# Patient Record
Sex: Female | Born: 1990 | Race: White | Hispanic: No | Marital: Married | Smoking: Former smoker
Health system: Southern US, Community
[De-identification: ages and names within clinical notes are randomized; demographics above are authoritative.]

## PROBLEM LIST (undated history)

## (undated) DIAGNOSIS — T7840XA Allergy, unspecified, initial encounter: Secondary | ICD-10-CM

## (undated) DIAGNOSIS — G43909 Migraine, unspecified, not intractable, without status migrainosus: Secondary | ICD-10-CM

## (undated) HISTORY — DX: Allergy, unspecified, initial encounter: T78.40XA

## (undated) HISTORY — PX: APPENDECTOMY: SHX54

## (undated) HISTORY — DX: Migraine, unspecified, not intractable, without status migrainosus: G43.909

## (undated) HISTORY — PX: WISDOM TOOTH EXTRACTION: SHX21

---

## 2014-08-08 ENCOUNTER — Encounter: Payer: Self-pay | Admitting: *Deleted

## 2014-08-08 ENCOUNTER — Telehealth: Payer: Self-pay | Admitting: *Deleted

## 2014-08-08 NOTE — Telephone Encounter (Signed)
Pre-Visit Call completed with patient and chart updated.   Pre-Visit Info documented in Specialty Comments under [employer].    

## 2014-08-09 ENCOUNTER — Encounter: Payer: Self-pay | Admitting: Physician Assistant

## 2014-08-09 ENCOUNTER — Ambulatory Visit (INDEPENDENT_AMBULATORY_CARE_PROVIDER_SITE_OTHER): Payer: Managed Care, Other (non HMO) | Admitting: Physician Assistant

## 2014-08-09 VITALS — BP 113/57 | HR 57 | Temp 98.3°F | Ht 67.0 in | Wt 142.0 lb

## 2014-08-09 DIAGNOSIS — F411 Generalized anxiety disorder: Secondary | ICD-10-CM

## 2014-08-09 DIAGNOSIS — G43709 Chronic migraine without aura, not intractable, without status migrainosus: Secondary | ICD-10-CM | POA: Diagnosis not present

## 2014-08-09 DIAGNOSIS — F419 Anxiety disorder, unspecified: Secondary | ICD-10-CM | POA: Insufficient documentation

## 2014-08-09 DIAGNOSIS — Z0000 Encounter for general adult medical examination without abnormal findings: Secondary | ICD-10-CM | POA: Diagnosis not present

## 2014-08-09 DIAGNOSIS — G43909 Migraine, unspecified, not intractable, without status migrainosus: Secondary | ICD-10-CM | POA: Insufficient documentation

## 2014-08-09 DIAGNOSIS — Z7689 Persons encountering health services in other specified circumstances: Secondary | ICD-10-CM

## 2014-08-09 LAB — URINALYSIS, ROUTINE W REFLEX MICROSCOPIC
Bilirubin Urine: NEGATIVE
Ketones, ur: NEGATIVE
Leukocytes, UA: NEGATIVE
Nitrite: NEGATIVE
PH: 6.5 (ref 5.0–8.0)
TOTAL PROTEIN, URINE-UPE24: NEGATIVE
UROBILINOGEN UA: 0.2 (ref 0.0–1.0)
Urine Glucose: NEGATIVE
WBC UA: NONE SEEN (ref 0–?)

## 2014-08-09 LAB — CBC
HCT: 42.5 % (ref 36.0–46.0)
HEMOGLOBIN: 14.3 g/dL (ref 12.0–15.0)
MCHC: 33.7 g/dL (ref 30.0–36.0)
MCV: 91.9 fl (ref 78.0–100.0)
Platelets: 260 10*3/uL (ref 150.0–400.0)
RBC: 4.62 Mil/uL (ref 3.87–5.11)
RDW: 14.2 % (ref 11.5–15.5)
WBC: 6.4 10*3/uL (ref 4.0–10.5)

## 2014-08-09 LAB — LIPID PANEL
Cholesterol: 164 mg/dL (ref 0–200)
HDL: 61 mg/dL (ref >39.00)
LDL Cholesterol: 91 mg/dL (ref 0–99)
NONHDL: 103
Total CHOL/HDL Ratio: 3
Triglycerides: 60 mg/dL (ref 0.0–149.0)
VLDL: 12 mg/dL (ref 0.0–40.0)

## 2014-08-09 LAB — HEPATIC FUNCTION PANEL
ALK PHOS: 62 U/L (ref 39–117)
ALT: 32 U/L (ref 0–35)
AST: 27 U/L (ref 0–37)
Albumin: 4.2 g/dL (ref 3.5–5.2)
BILIRUBIN DIRECT: 0.1 mg/dL (ref 0.0–0.3)
BILIRUBIN TOTAL: 0.5 mg/dL (ref 0.2–1.2)
Total Protein: 7.2 g/dL (ref 6.0–8.3)

## 2014-08-09 LAB — BASIC METABOLIC PANEL
BUN: 9 mg/dL (ref 6–23)
CO2: 30 mEq/L (ref 19–32)
Calcium: 9.9 mg/dL (ref 8.4–10.5)
Chloride: 102 mEq/L (ref 96–112)
Creatinine, Ser: 0.87 mg/dL (ref 0.40–1.20)
GFR: 84.08 mL/min (ref >60.00)
Glucose, Bld: 85 mg/dL (ref 70–99)
Potassium: 3.7 mEq/L (ref 3.5–5.1)
Sodium: 137 mEq/L (ref 135–145)

## 2014-08-09 NOTE — Assessment & Plan Note (Addendum)
Health Maintenance reviewed.  PAP up-to-date.  Referral placed to gynecology per patient request.  Will check [facility] to assess tetanus immunization status (not working at time of visit). Will update if indicated. Depression screen negative.  Preventive care discussed with patient.  Handout given in AVS. Will obtain fasting labs -- CBC, BMP, LFTs, Lipid panel and UA today.

## 2014-08-09 NOTE — Patient Instructions (Signed)
Please go to the lab for blood work. I will call you with your results. If you have any Wellness Forms needing completion, please send them to me. Talk to your insurance about coverage for a counselor. Consider calling our counselors to set up an appointment.  Preventive Care for Adults A healthy lifestyle and preventive care can promote health and wellness. Preventive health guidelines for women include the following key practices.  A routine yearly physical is a good way to check with your health care provider about your health and preventive screening. It is a chance to share any concerns and updates on your health and to receive a thorough exam.  Visit your dentist for a routine exam and preventive care every 6 months. Brush your teeth twice a day and floss once a day. Good oral hygiene prevents tooth decay and gum disease.  The frequency of eye exams is based on your age, health, family medical history, use of contact lenses, and other factors. Follow your health care provider's recommendations for frequency of eye exams.  Eat a healthy diet. Foods like vegetables, fruits, whole grains, low-fat dairy products, and lean protein foods contain the nutrients you need without too many calories. Decrease your intake of foods high in solid fats, added sugars, and salt. Eat the right amount of calories for you.Get information about a proper diet from your health care provider, if necessary.  Regular physical exercise is one of the most important things you can do for your health. Most adults should get at least 150 minutes of moderate-intensity exercise (any activity that increases your heart rate and causes you to sweat) each week. In addition, most adults need muscle-strengthening exercises on 2 or more days a week.  Maintain a healthy weight. The body mass index (BMI) is a screening tool to identify possible weight problems. It provides an estimate of body fat based on height and weight. Your  health care provider can find your BMI and can help you achieve or maintain a healthy weight.For adults 20 years and older:  A BMI below 18.5 is considered underweight.  A BMI of 18.5 to 24.9 is normal.  A BMI of 25 to 29.9 is considered overweight.  A BMI of 30 and above is considered obese.  Maintain normal blood lipids and cholesterol levels by exercising and minimizing your intake of saturated fat. Eat a balanced diet with plenty of fruit and vegetables. Blood tests for lipids and cholesterol should begin at age 71 and be repeated every 5 years. If your lipid or cholesterol levels are high, you are over 50, or you are at high risk for heart disease, you may need your cholesterol levels checked more frequently.Ongoing high lipid and cholesterol levels should be treated with medicines if diet and exercise are not working.  If you smoke, find out from your health care provider how to quit. If you do not use tobacco, do not start.  Lung cancer screening is recommended for adults aged 9-80 years who are at high risk for developing lung cancer because of a history of smoking. A yearly low-dose CT scan of the lungs is recommended for people who have at least a 30-pack-year history of smoking and are a current smoker or have quit within the past 15 years. A pack year of smoking is smoking an average of 1 pack of cigarettes a day for 1 year (for example: 1 pack a day for 30 years or 2 packs a day for 15 years). Yearly screening  should continue until the smoker has stopped smoking for at least 15 years. Yearly screening should be stopped for people who develop a health problem that would prevent them from having lung cancer treatment.  If you are pregnant, do not drink alcohol. If you are breastfeeding, be very cautious about drinking alcohol. If you are not pregnant and choose to drink alcohol, do not have more than 1 drink per day. One drink is considered to be 12 ounces (355 mL) of beer, 5 ounces  (148 mL) of wine, or 1.5 ounces (44 mL) of liquor.  Avoid use of street drugs. Do not share needles with anyone. Ask for help if you need support or instructions about stopping the use of drugs.  High blood pressure causes heart disease and increases the risk of stroke. Your blood pressure should be checked at least every 1 to 2 years. Ongoing high blood pressure should be treated with medicines if weight loss and exercise do not work.  If you are 76-2 years old, ask your health care provider if you should take aspirin to prevent strokes.  Diabetes screening involves taking a blood sample to check your fasting blood sugar level. This should be done once every 3 years, after age 19, if you are within normal weight and without risk factors for diabetes. Testing should be considered at a younger age or be carried out more frequently if you are overweight and have at least 1 risk factor for diabetes.  Breast cancer screening is essential preventive care for women. You should practice "breast self-awareness." This means understanding the normal appearance and feel of your breasts and may include breast self-examination. Any changes detected, no matter how small, should be reported to a health care provider. Women in their 47s and 30s should have a clinical breast exam (CBE) by a health care provider as part of a regular health exam every 1 to 3 years. After age 83, women should have a CBE every year. Starting at age 72, women should consider having a mammogram (breast X-ray test) every year. Women who have a family history of breast cancer should talk to their health care provider about genetic screening. Women at a high risk of breast cancer should talk to their health care providers about having an MRI and a mammogram every year.  Breast cancer gene (BRCA)-related cancer risk assessment is recommended for women who have family members with BRCA-related cancers. BRCA-related cancers include breast, ovarian,  tubal, and peritoneal cancers. Having family members with these cancers may be associated with an increased risk for harmful changes (mutations) in the breast cancer genes BRCA1 and BRCA2. Results of the assessment will determine the need for genetic counseling and BRCA1 and BRCA2 testing.  Routine pelvic exams to screen for cancer are no longer recommended for nonpregnant women who are considered low risk for cancer of the pelvic organs (ovaries, uterus, and vagina) and who do not have symptoms. Ask your health care provider if a screening pelvic exam is right for you.  If you have had past treatment for cervical cancer or a condition that could lead to cancer, you need Pap tests and screening for cancer for at least 20 years after your treatment. If Pap tests have been discontinued, your risk factors (such as having a new sexual partner) need to be reassessed to determine if screening should be resumed. Some women have medical problems that increase the chance of getting cervical cancer. In these cases, your health care provider may recommend  more frequent screening and Pap tests.  The HPV test is an additional test that may be used for cervical cancer screening. The HPV test looks for the virus that can cause the cell changes on the cervix. The cells collected during the Pap test can be tested for HPV. The HPV test could be used to screen women aged 89 years and older, and should be used in women of any age who have unclear Pap test results. After the age of 78, women should have HPV testing at the same frequency as a Pap test.  Colorectal cancer can be detected and often prevented. Most routine colorectal cancer screening begins at the age of 49 years and continues through age 23 years. However, your health care provider may recommend screening at an earlier age if you have risk factors for colon cancer. On a yearly basis, your health care provider may provide home test kits to check for hidden blood in  the stool. Use of a small camera at the end of a tube, to directly examine the colon (sigmoidoscopy or colonoscopy), can detect the earliest forms of colorectal cancer. Talk to your health care provider about this at age 24, when routine screening begins. Direct exam of the colon should be repeated every 5-10 years through age 68 years, unless early forms of pre-cancerous polyps or small growths are found.  People who are at an increased risk for hepatitis B should be screened for this virus. You are considered at high risk for hepatitis B if:  You were born in a country where hepatitis B occurs often. Talk with your health care provider about which countries are considered high risk.  Your parents were born in a high-risk country and you have not received a shot to protect against hepatitis B (hepatitis B vaccine).  You have HIV or AIDS.  You use needles to inject street drugs.  You live with, or have sex with, someone who has hepatitis B.  You get hemodialysis treatment.  You take certain medicines for conditions like cancer, organ transplantation, and autoimmune conditions.  Hepatitis C blood testing is recommended for all people born from [birth year] through 1965 and any individual with known risks for hepatitis C.  Practice safe sex. Use condoms and avoid high-risk sexual practices to reduce the spread of sexually transmitted infections (STIs). STIs include gonorrhea, chlamydia, syphilis, trichomonas, herpes, HPV, and human immunodeficiency virus (HIV). Herpes, HIV, and HPV are viral illnesses that have no cure. They can result in disability, cancer, and death.  You should be screened for sexually transmitted illnesses (STIs) including gonorrhea and chlamydia if:  You are sexually active and are younger than 24 years.  You are older than 24 years and your health care provider tells you that you are at risk for this type of infection.  Your sexual activity has changed since you were last  screened and you are at an increased risk for chlamydia or gonorrhea. Ask your health care provider if you are at risk.  If you are at risk of being infected with HIV, it is recommended that you take a prescription medicine daily to prevent HIV infection. This is called preexposure prophylaxis (PrEP). You are considered at risk if:  You are a heterosexual woman, are sexually active, and are at increased risk for HIV infection.  You take drugs by injection.  You are sexually active with a partner who has HIV.  Talk with your health care provider about whether you are at high risk  of being infected with HIV. If you choose to begin PrEP, you should first be tested for HIV. You should then be tested every 3 months for as long as you are taking PrEP.  Osteoporosis is a disease in which the bones lose minerals and strength with aging. This can result in serious bone fractures or breaks. The risk of osteoporosis can be identified using a bone density scan. Women ages 35 years and over and women at risk for fractures or osteoporosis should discuss screening with their health care providers. Ask your health care provider whether you should take a calcium supplement or vitamin D to reduce the rate of osteoporosis.  Menopause can be associated with physical symptoms and risks. Hormone replacement therapy is available to decrease symptoms and risks. You should talk to your health care provider about whether hormone replacement therapy is right for you.  Use sunscreen. Apply sunscreen liberally and repeatedly throughout the day. You should seek shade when your shadow is shorter than you. Protect yourself by wearing long sleeves, pants, a wide-brimmed hat, and sunglasses year round, whenever you are outdoors.  Once a month, do a whole body skin exam, using a mirror to look at the skin on your back. Tell your health care provider of new moles, moles that have irregular borders, moles that are larger than a pencil  eraser, or moles that have changed in shape or color.  Stay current with required vaccines (immunizations).  Influenza vaccine. All adults should be immunized every year.  Tetanus, diphtheria, and acellular pertussis (Td, Tdap) vaccine. Pregnant women should receive 1 dose of Tdap vaccine during each pregnancy. The dose should be obtained regardless of the length of time since the last dose. Immunization is preferred during the 27th-36th week of gestation. An adult who has not previously received Tdap or who does not know her vaccine status should receive 1 dose of Tdap. This initial dose should be followed by tetanus and diphtheria toxoids (Td) booster doses every 10 years. Adults with an unknown or incomplete history of completing a 3-dose immunization series with Td-containing vaccines should begin or complete a primary immunization series including a Tdap dose. Adults should receive a Td booster every 10 years.  Varicella vaccine. An adult without evidence of immunity to varicella should receive 2 doses or a second dose if she has previously received 1 dose. Pregnant females who do not have evidence of immunity should receive the first dose after pregnancy. This first dose should be obtained before leaving the health care facility. The second dose should be obtained 4-8 weeks after the first dose.  Human papillomavirus (HPV) vaccine. Females aged 13-26 years who have not received the vaccine previously should obtain the 3-dose series. The vaccine is not recommended for use in pregnant females. However, pregnancy testing is not needed before receiving a dose. If a female is found to be pregnant after receiving a dose, no treatment is needed. In that case, the remaining doses should be delayed until after the pregnancy. Immunization is recommended for any person with an immunocompromised condition through the age of 87 years if she did not get any or all doses earlier. During the 3-dose series, the  second dose should be obtained 4-8 weeks after the first dose. The third dose should be obtained 24 weeks after the first dose and 16 weeks after the second dose.  Zoster vaccine. One dose is recommended for adults aged 47 years or older unless certain conditions are present.  Measles, mumps,  and rubella (MMR) vaccine. Adults born before [birth year] generally are considered immune to measles and mumps. Adults born in [birth year] or later should have 1 or more doses of MMR vaccine unless there is a contraindication to the vaccine or there is laboratory evidence of immunity to each of the three diseases. A routine second dose of MMR vaccine should be obtained at least 28 days after the first dose for students attending postsecondary schools, health care workers, or international travelers. People who received inactivated measles vaccine or an unknown type of measles vaccine during 1963-1967 should receive 2 doses of MMR vaccine. People who received inactivated mumps vaccine or an unknown type of mumps vaccine before 1979 and are at high risk for mumps infection should consider immunization with 2 doses of MMR vaccine. For females of childbearing age, rubella immunity should be determined. If there is no evidence of immunity, females who are not pregnant should be vaccinated. If there is no evidence of immunity, females who are pregnant should delay immunization until after pregnancy. Unvaccinated health care workers born before [birth year] who lack laboratory evidence of measles, mumps, or rubella immunity or laboratory confirmation of disease should consider measles and mumps immunization with 2 doses of MMR vaccine or rubella immunization with 1 dose of MMR vaccine.  Pneumococcal 13-valent conjugate (PCV13) vaccine. When indicated, a person who is uncertain of her immunization history and has no record of immunization should receive the PCV13 vaccine. An adult aged 15 years or older who has certain medical conditions and has not  been previously immunized should receive 1 dose of PCV13 vaccine. This PCV13 should be followed with a dose of pneumococcal polysaccharide (PPSV23) vaccine. The PPSV23 vaccine dose should be obtained at least 8 weeks after the dose of PCV13 vaccine. An adult aged 38 years or older who has certain medical conditions and previously received 1 or more doses of PPSV23 vaccine should receive 1 dose of PCV13. The PCV13 vaccine dose should be obtained 1 or more years after the last PPSV23 vaccine dose.  Pneumococcal polysaccharide (PPSV23) vaccine. When PCV13 is also indicated, PCV13 should be obtained first. All adults aged 57 years and older should be immunized. An adult younger than age 75 years who has certain medical conditions should be immunized. Any person who resides in a nursing home or long-term care facility should be immunized. An adult smoker should be immunized. People with an immunocompromised condition and certain other conditions should receive both PCV13 and PPSV23 vaccines. People with human immunodeficiency virus (HIV) infection should be immunized as soon as possible after diagnosis. Immunization during chemotherapy or radiation therapy should be avoided. Routine use of PPSV23 vaccine is not recommended for American Indians, [place] Natives, or people younger than 65 years unless there are medical conditions that require PPSV23 vaccine. When indicated, people who have unknown immunization and have no record of immunization should receive PPSV23 vaccine. One-time revaccination 5 years after the first dose of PPSV23 is recommended for people aged 19-64 years who have chronic kidney failure, nephrotic syndrome, asplenia, or immunocompromised conditions. People who received 1-2 doses of PPSV23 before age 8 years should receive another dose of PPSV23 vaccine at age 51 years or later if at least 5 years have passed since the previous dose. Doses of PPSV23 are not needed for people immunized with PPSV23 at  or after age 47 years.  Meningococcal vaccine. Adults with asplenia or persistent complement component deficiencies should receive 2 doses of quadrivalent meningococcal conjugate (MenACWY-D) vaccine. The doses  should be obtained at least 2 months apart. Microbiologists working with certain meningococcal bacteria, [employer] recruits, people at risk during an outbreak, and people who travel to or live in countries with a high rate of meningitis should be immunized. A first-year college student up through age 51 years who is living in a residence hall should receive a dose if she did not receive a dose on or after her 16th birthday. Adults who have certain high-risk conditions should receive one or more doses of vaccine.  Hepatitis A vaccine. Adults who wish to be protected from this disease, have certain high-risk conditions, work with hepatitis A-infected animals, work in hepatitis A research labs, or travel to or work in countries with a high rate of hepatitis A should be immunized. Adults who were previously unvaccinated and who anticipate close contact with an international adoptee during the first 60 days after arrival in the [place] from a country with a high rate of hepatitis A should be immunized.  Hepatitis B vaccine. Adults who wish to be protected from this disease, have certain high-risk conditions, may be exposed to blood or other infectious body fluids, are household contacts or sex partners of hepatitis B positive people, are clients or workers in certain care facilities, or travel to or work in countries with a high rate of hepatitis B should be immunized.  Haemophilus influenzae type b (Hib) vaccine. A previously unvaccinated person with asplenia or sickle cell disease or having a scheduled splenectomy should receive 1 dose of Hib vaccine. Regardless of previous immunization, a recipient of a hematopoietic stem cell transplant should receive a 3-dose series 6-12 months after her  successful transplant. Hib vaccine is not recommended for adults with HIV infection. Preventive Services / Frequency Ages 34 to 36 years  Blood pressure check.** / Every 1 to 2 years.  Lipid and cholesterol check.** / Every 5 years beginning at age 86.  Clinical breast exam.** / Every 3 years for women in their 15s and 63s.  BRCA-related cancer risk assessment.** / For women who have family members with a BRCA-related cancer (breast, ovarian, tubal, or peritoneal cancers).  Pap test.** / Every 2 years from ages 72 through 70. Every 3 years starting at age 35 through age 55 or 75 with a history of 3 consecutive normal Pap tests.  HPV screening.** / Every 3 years from ages 46 through ages 20 to 42 with a history of 3 consecutive normal Pap tests.  Hepatitis C blood test.** / For any individual with known risks for hepatitis C.  Skin self-exam. / Monthly.  Influenza vaccine. / Every year.  Tetanus, diphtheria, and acellular pertussis (Tdap, Td) vaccine.** / Consult your health care provider. Pregnant women should receive 1 dose of Tdap vaccine during each pregnancy. 1 dose of Td every 10 years.  Varicella vaccine.** / Consult your health care provider. Pregnant females who do not have evidence of immunity should receive the first dose after pregnancy.  HPV vaccine. / 3 doses over 6 months, if 62 and younger. The vaccine is not recommended for use in pregnant females. However, pregnancy testing is not needed before receiving a dose.  Measles, mumps, rubella (MMR) vaccine.** / You need at least 1 dose of MMR if you were born in 1957 or later. You may also need a 2nd dose. For females of childbearing age, rubella immunity should be determined. If there is no evidence of immunity, females who are not pregnant should be vaccinated. If there is no evidence  of immunity, females who are pregnant should delay immunization until after pregnancy.  Pneumococcal 13-valent conjugate (PCV13) vaccine.** /  Consult your health care provider.  Pneumococcal polysaccharide (PPSV23) vaccine.** / 1 to 2 doses if you smoke cigarettes or if you have certain conditions.  Meningococcal vaccine.** / 1 dose if you are age 76 to 79 years and a Market researcher living in a residence hall, or have one of several medical conditions, you need to get vaccinated against meningococcal disease. You may also need additional booster doses.  Hepatitis A vaccine.** / Consult your health care provider.  Hepatitis B vaccine.** / Consult your health care provider.  Haemophilus influenzae type b (Hib) vaccine.** / Consult your health care provider. Ages 33 to 29 years  Blood pressure check.** / Every 1 to 2 years.  Lipid and cholesterol check.** / Every 5 years beginning at age 43 years.  Lung cancer screening. / Every year if you are aged 96-80 years and have a 30-pack-year history of smoking and currently smoke or have quit within the past 15 years. Yearly screening is stopped once you have quit smoking for at least 15 years or develop a health problem that would prevent you from having lung cancer treatment.  Clinical breast exam.** / Every year after age 38 years.  BRCA-related cancer risk assessment.** / For women who have family members with a BRCA-related cancer (breast, ovarian, tubal, or peritoneal cancers).  Mammogram.** / Every year beginning at age 27 years and continuing for as long as you are in good health. Consult with your health care provider.  Pap test.** / Every 3 years starting at age 37 years through age 2 or 27 years with a history of 3 consecutive normal Pap tests.  HPV screening.** / Every 3 years from ages 29 years through ages 19 to 34 years with a history of 3 consecutive normal Pap tests.  Fecal occult blood test (FOBT) of stool. / Every year beginning at age 37 years and continuing until age 22 years. You may not need to do this test if you get a colonoscopy every 10  years.  Flexible sigmoidoscopy or colonoscopy.** / Every 5 years for a flexible sigmoidoscopy or every 10 years for a colonoscopy beginning at age 43 years and continuing until age 20 years.  Hepatitis C blood test.** / For all people born from [birth year] through 1967 and any individual with known risks for hepatitis C.  Skin self-exam. / Monthly.  Influenza vaccine. / Every year.  Tetanus, diphtheria, and acellular pertussis (Tdap/Td) vaccine.** / Consult your health care provider. Pregnant women should receive 1 dose of Tdap vaccine during each pregnancy. 1 dose of Td every 10 years.  Varicella vaccine.** / Consult your health care provider. Pregnant females who do not have evidence of immunity should receive the first dose after pregnancy.  Zoster vaccine.** / 1 dose for adults aged 80 years or older.  Measles, mumps, rubella (MMR) vaccine.** / You need at least 1 dose of MMR if you were born in 1957 or later. You may also need a 2nd dose. For females of childbearing age, rubella immunity should be determined. If there is no evidence of immunity, females who are not pregnant should be vaccinated. If there is no evidence of immunity, females who are pregnant should delay immunization until after pregnancy.  Pneumococcal 13-valent conjugate (PCV13) vaccine.** / Consult your health care provider.  Pneumococcal polysaccharide (PPSV23) vaccine.** / 1 to 2 doses if you smoke cigarettes or if  you have certain conditions.  Meningococcal vaccine.** / Consult your health care provider.  Hepatitis A vaccine.** / Consult your health care provider.  Hepatitis B vaccine.** / Consult your health care provider.  Haemophilus influenzae type b (Hib) vaccine.** / Consult your health care provider. Ages 34 years and over  Blood pressure check.** / Every 1 to 2 years.  Lipid and cholesterol check.** / Every 5 years beginning at age 46 years.  Lung cancer screening. / Every year if you are aged 41-80 years  and have a 30-pack-year history of smoking and currently smoke or have quit within the past 15 years. Yearly screening is stopped once you have quit smoking for at least 15 years or develop a health problem that would prevent you from having lung cancer treatment.  Clinical breast exam.** / Every year after age 35 years.  BRCA-related cancer risk assessment.** / For women who have family members with a BRCA-related cancer (breast, ovarian, tubal, or peritoneal cancers).  Mammogram.** / Every year beginning at age 60 years and continuing for as long as you are in good health. Consult with your health care provider.  Pap test.** / Every 3 years starting at age 30 years through age 62 or 65 years with 3 consecutive normal Pap tests. Testing can be stopped between 65 and 70 years with 3 consecutive normal Pap tests and no abnormal Pap or HPV tests in the past 10 years.  HPV screening.** / Every 3 years from ages 67 years through ages 31 or 44 years with a history of 3 consecutive normal Pap tests. Testing can be stopped between 65 and 70 years with 3 consecutive normal Pap tests and no abnormal Pap or HPV tests in the past 10 years.  Fecal occult blood test (FOBT) of stool. / Every year beginning at age 41 years and continuing until age 31 years. You may not need to do this test if you get a colonoscopy every 10 years.  Flexible sigmoidoscopy or colonoscopy.** / Every 5 years for a flexible sigmoidoscopy or every 10 years for a colonoscopy beginning at age 84 years and continuing until age 43 years.  Hepatitis C blood test.** / For all people born from [birth year] through 1967 and any individual with known risks for hepatitis C.  Osteoporosis screening.** / A one-time screening for women ages 80 years and over and women at risk for fractures or osteoporosis.  Skin self-exam. / Monthly.  Influenza vaccine. / Every year.  Tetanus, diphtheria, and acellular pertussis (Tdap/Td) vaccine.** / 1 dose of Td  every 10 years.  Varicella vaccine.** / Consult your health care provider.  Zoster vaccine.** / 1 dose for adults aged 70 years or older.  Pneumococcal 13-valent conjugate (PCV13) vaccine.** / Consult your health care provider.  Pneumococcal polysaccharide (PPSV23) vaccine.** / 1 dose for all adults aged 83 years and older.  Meningococcal vaccine.** / Consult your health care provider.  Hepatitis A vaccine.** / Consult your health care provider.  Hepatitis B vaccine.** / Consult your health care provider.  Haemophilus influenzae type b (Hib) vaccine.** / Consult your health care provider. ** Family history and personal history of risk and conditions may change your health care provider's recommendations. Document Released: 05/20/2001 Document Revised: 08/08/2013 Document Reviewed: 08/19/2010 [facility] Patient Information 2015 [place], [place]. This information is not intended to replace advice given to you by your health care provider. Make sure you discuss any questions you have with your health care provider.

## 2014-08-09 NOTE — Assessment & Plan Note (Signed)
Mild.  Does not wish to start medication.  Counseling services discussed.  Patient encouraged to schedule appointment with one of our counselors here.  Follow-up if symptoms are worsening.

## 2014-08-09 NOTE — Progress Notes (Signed)
Patient presents to clinic today to establish care. Is requesting CPE today.  Is fasting.  Acute Concerns: Anxiety -- Patient endorses mild anxiety stemming mostly from new job and financial struggles.  Denies depressed mood or anhedonia.  Denies panic attack.  Denies SI/HI.  Chronic Issues: Migraines -- on prophylactic treatment with Gabapentin 600 mg tablet.  Endorses good relief of symptoms.  Health Maintenance: Dental -- up-to-date Vision -- overdue Immunizations -- Unsure of last Tetanus.  Thinks 2 years ago. PAP -- Last 2014.  No hx of abnormal PAP smears  Past Medical History  Diagnosis Date  . Migraines   . Allergy     Past Surgical History  Procedure Laterality Date  . Wisdom tooth extraction      Current Outpatient Prescriptions on File Prior to Visit  Medication Sig Dispense Refill  . Gabapentin, Once-Daily, 600 MG TABS Take 1 tablet by mouth daily.    . Multiple Vitamins-Minerals (WOMENS MULTIVITAMIN PLUS PO) Take 1 tablet by mouth daily.    . norethindrone (MICRONOR,CAMILA,ERRIN) 0.35 MG tablet Take 1 tablet by mouth daily.     No current facility-administered medications on file prior to visit.    Allergies  Allergen Reactions  . Penicillins Other (See Comments)    Unsure of reaction, was as a child    Family History  Problem Relation Age of Onset  . Anemia Mother   . Hypertension Father   . Cancer Maternal Grandmother     unsure of type    History   Social History  . Marital Status: Single    Spouse Name: N/A  . Number of Children: N/A  . Years of Education: N/A   Occupational History  . Not on file.   Social History Main Topics  . Smoking status: Light Tobacco Smoker  . Smokeless tobacco: Never Used  . Alcohol Use: 3.0 oz/week    5 Standard drinks or equivalent per week  . Drug Use: Yes     Comment: marijuana - rare  . Sexual Activity:    Partners: Male   Other Topics Concern  . Not on file   Social History Narrative   Airline pilotngagement Specialist (advertising)   Engaged; No children   Review of Systems  Constitutional: Negative for fever and weight loss.  HENT: Negative for ear discharge, ear pain, hearing loss and tinnitus.   Eyes: Negative for blurred vision and double vision.  Respiratory: Negative for cough and shortness of breath.   Cardiovascular: Negative for chest pain, palpitations and orthopnea.  Gastrointestinal: Negative for heartburn, nausea, vomiting, abdominal pain, diarrhea, constipation and blood in stool.  Genitourinary: Negative for dysuria, urgency and frequency.  Neurological: Negative for dizziness, loss of consciousness and headaches.  Endo/Heme/Allergies: Positive for environmental allergies.  Psychiatric/Behavioral: Negative for depression, suicidal ideas, hallucinations and substance abuse. The patient is nervous/anxious. The patient does not have insomnia.    BP 113/57 mmHg  Pulse 57  Temp(Src) 98.3 F (36.8 C)  Ht 5\' 7"  (1.702 m)  Wt 142 lb (64.411 kg)  BMI 22.24 kg/m2  SpO2 100%  Physical Exam  Constitutional: She is oriented to person, place, and time and well-developed, well-nourished, and in no distress.  HENT:  Head: Normocephalic and atraumatic.  Right Ear: External ear normal.  Left Ear: External ear normal.  Nose: Nose normal.  Mouth/Throat: Oropharynx is clear and moist. No oropharyngeal exudate.  Eyes: Conjunctivae are normal. Pupils are equal, round, and reactive to light.  Neck: Neck supple. No thyromegaly present.  Cardiovascular: Normal rate, normal heart sounds and intact distal pulses.   Pulmonary/Chest: Effort normal and breath sounds normal. No respiratory distress. She has no wheezes. She has no rales. She exhibits no tenderness.  Abdominal: Soft. Bowel sounds are normal. She exhibits no distension and no mass. There is no tenderness. There is no rebound and no guarding.  Musculoskeletal: Normal range of motion.  Lymphadenopathy:    She has no cervical  adenopathy.  Neurological: She is alert and oriented to person, place, and time. She has normal reflexes. No cranial nerve deficit.  Skin: Skin is warm and dry. No rash noted.  Psychiatric: Affect normal.  Vitals reviewed.  Assessment/Plan: Visit for preventive health examination Health Maintenance reviewed.  PAP up-to-date.  Referral placed to gynecology per patient request.  Will check [facility] to assess tetanus immunization status (not working at time of visit). Will update if indicated. Depression screen negative.  Preventive care discussed with patient.  Handout given in AVS. Will obtain fasting labs -- CBC, BMP, LFTs, Lipid panel and UA today.   Migraines Well-controlled with Gabapentin daily. No breakthrough symptoms.  Continue current regimen.   Anxiety state Mild.  Does not wish to start medication.  Counseling services discussed.  Patient encouraged to schedule appointment with one of our counselors here.  Follow-up if symptoms are worsening.

## 2014-08-09 NOTE — Progress Notes (Signed)
Pre visit review using our clinic review tool, if applicable. No additional management support is needed unless otherwise documented below in the visit note. 

## 2014-08-09 NOTE — Assessment & Plan Note (Signed)
Well-controlled with Gabapentin daily. No breakthrough symptoms.  Continue current regimen.

## 2014-08-16 ENCOUNTER — Telehealth: Payer: Self-pay | Admitting: *Deleted

## 2014-08-16 NOTE — Telephone Encounter (Signed)
Form faxed successfully to [facility] at [phone]. Sent for scanning. JG//CMA  

## 2014-08-16 NOTE — Telephone Encounter (Signed)
Wellness form completed.  Ready for fax.

## 2014-08-16 NOTE — Telephone Encounter (Signed)
Pt dropped off wellness form. Form filled out and forwarded to [employer]. JG//CMA

## 2015-01-18 ENCOUNTER — Inpatient Hospital Stay (HOSPITAL_BASED_OUTPATIENT_CLINIC_OR_DEPARTMENT_OTHER)
Admission: EM | Admit: 2015-01-18 | Discharge: 2015-01-22 | DRG: 342 | Disposition: A | Discharge status: Home / Self Care | Payer: Managed Care, Other (non HMO) | Attending: Surgery | Admitting: Surgery

## 2015-01-18 ENCOUNTER — Emergency Department (HOSPITAL_BASED_OUTPATIENT_CLINIC_OR_DEPARTMENT_OTHER): Payer: Managed Care, Other (non HMO)

## 2015-01-18 ENCOUNTER — Encounter (HOSPITAL_BASED_OUTPATIENT_CLINIC_OR_DEPARTMENT_OTHER): Payer: Self-pay | Admitting: *Deleted

## 2015-01-18 DIAGNOSIS — Z809 Family history of malignant neoplasm, unspecified: Secondary | ICD-10-CM

## 2015-01-18 DIAGNOSIS — Z87891 Personal history of nicotine dependence: Secondary | ICD-10-CM

## 2015-01-18 DIAGNOSIS — R109 Unspecified abdominal pain: Secondary | ICD-10-CM | POA: Diagnosis present

## 2015-01-18 DIAGNOSIS — G43709 Chronic migraine without aura, not intractable, without status migrainosus: Secondary | ICD-10-CM | POA: Diagnosis present

## 2015-01-18 DIAGNOSIS — N390 Urinary tract infection, site not specified: Secondary | ICD-10-CM | POA: Diagnosis present

## 2015-01-18 DIAGNOSIS — Z8249 Family history of ischemic heart disease and other diseases of the circulatory system: Secondary | ICD-10-CM

## 2015-01-18 DIAGNOSIS — K37 Unspecified appendicitis: Secondary | ICD-10-CM | POA: Diagnosis present

## 2015-01-18 DIAGNOSIS — K353 Acute appendicitis with localized peritonitis, without perforation or gangrene: Secondary | ICD-10-CM

## 2015-01-18 DIAGNOSIS — B9620 Unspecified Escherichia coli [E. coli] as the cause of diseases classified elsewhere: Secondary | ICD-10-CM | POA: Diagnosis present

## 2015-01-18 DIAGNOSIS — K3580 Unspecified acute appendicitis: Secondary | ICD-10-CM | POA: Diagnosis not present

## 2015-01-18 DIAGNOSIS — R1031 Right lower quadrant pain: Secondary | ICD-10-CM | POA: Diagnosis not present

## 2015-01-18 DIAGNOSIS — A419 Sepsis, unspecified organism: Secondary | ICD-10-CM

## 2015-01-18 HISTORY — DX: Unspecified appendicitis: K37

## 2015-01-18 LAB — CBC WITH DIFFERENTIAL/PLATELET
BASOS ABS: 0 10*3/uL (ref 0.0–0.1)
BASOS PCT: 0 %
Eosinophils Absolute: 0 10*3/uL (ref 0.0–0.7)
Eosinophils Relative: 0 %
HEMATOCRIT: 37 % (ref 36.0–46.0)
HEMOGLOBIN: 12.6 g/dL (ref 12.0–15.0)
LYMPHS PCT: 7 %
Lymphs Abs: 0.9 10*3/uL (ref 0.7–4.0)
MCH: 32.2 pg (ref 26.0–34.0)
MCHC: 34.1 g/dL (ref 30.0–36.0)
MCV: 94.6 fL (ref 78.0–100.0)
MONOS PCT: 12 %
Monocytes Absolute: 1.5 10*3/uL — ABNORMAL HIGH (ref 0.1–1.0)
NEUTROS ABS: 10.1 10*3/uL — AB (ref 1.7–7.7)
NEUTROS PCT: 81 %
Platelets: 214 10*3/uL (ref 150–400)
RBC: 3.91 MIL/uL (ref 3.87–5.11)
RDW: 12.5 % (ref 11.5–15.5)
WBC: 12.6 10*3/uL — ABNORMAL HIGH (ref 4.0–10.5)

## 2015-01-18 LAB — COMPREHENSIVE METABOLIC PANEL
ALBUMIN: 3.6 g/dL (ref 3.5–5.0)
ALK PHOS: 46 U/L (ref 38–126)
ALT: 15 U/L (ref 14–54)
AST: 17 U/L (ref 15–41)
Anion gap: 7 (ref 5–15)
BILIRUBIN TOTAL: 0.8 mg/dL (ref 0.3–1.2)
BUN: 5 mg/dL — ABNORMAL LOW (ref 6–20)
CALCIUM: 9 mg/dL (ref 8.9–10.3)
CO2: 26 mmol/L (ref 22–32)
CREATININE: 0.75 mg/dL (ref 0.44–1.00)
Chloride: 104 mmol/L (ref 101–111)
GFR calc Af Amer: >60 mL/min (ref >60)
GFR calc non Af Amer: >60 mL/min (ref >60)
GLUCOSE: 103 mg/dL — AB (ref 65–99)
Potassium: 3.6 mmol/L (ref 3.5–5.1)
SODIUM: 137 mmol/L (ref 135–145)
Total Protein: 6.7 g/dL (ref 6.5–8.1)

## 2015-01-18 LAB — URINALYSIS, ROUTINE W REFLEX MICROSCOPIC
Bilirubin Urine: NEGATIVE
GLUCOSE, UA: NEGATIVE mg/dL
Ketones, ur: 15 mg/dL — AB
Nitrite: NEGATIVE
PROTEIN: NEGATIVE mg/dL
SPECIFIC GRAVITY, URINE: 1.006 (ref 1.005–1.030)
Urobilinogen, UA: 0.2 mg/dL (ref 0.0–1.0)
pH: 6.5 (ref 5.0–8.0)

## 2015-01-18 LAB — PREGNANCY, URINE: Preg Test, Ur: NEGATIVE

## 2015-01-18 LAB — LIPASE, BLOOD: Lipase: 20 U/L — ABNORMAL LOW (ref 22–51)

## 2015-01-18 LAB — I-STAT CG4 LACTIC ACID, ED: Lactic Acid, Venous: 0.56 mmol/L (ref 0.5–2.0)

## 2015-01-18 LAB — URINE MICROSCOPIC-ADD ON

## 2015-01-18 MED ORDER — SODIUM CHLORIDE 0.9 % IV BOLUS (SEPSIS)
1000.0000 mL | Freq: Once | INTRAVENOUS | Status: AC
  Administered 2015-01-18: 1000 mL via INTRAVENOUS

## 2015-01-18 MED ORDER — MORPHINE SULFATE (PF) 4 MG/ML IV SOLN
4.0000 mg | INTRAVENOUS | Status: AC | PRN
  Administered 2015-01-18 – 2015-01-19 (×3): 4 mg via INTRAVENOUS
  Filled 2015-01-18 (×3): qty 1

## 2015-01-18 MED ORDER — CETYLPYRIDINIUM CHLORIDE 0.05 % MT LIQD
7.0000 mL | Freq: Two times a day (BID) | OROMUCOSAL | Status: DC
  Administered 2015-01-19: 7 mL via OROMUCOSAL

## 2015-01-18 MED ORDER — PIPERACILLIN-TAZOBACTAM 3.375 G IVPB: 3.3750 g | Freq: Three times a day (TID) | INTRAVENOUS | Status: DC

## 2015-01-18 MED ORDER — MORPHINE SULFATE (PF) 4 MG/ML IV SOLN
8.0000 mg | Freq: Once | INTRAVENOUS | Status: AC
  Administered 2015-01-18: 8 mg via INTRAVENOUS
  Filled 2015-01-18: qty 2

## 2015-01-18 MED ORDER — ONDANSETRON HCL 4 MG/2ML IJ SOLN
4.0000 mg | Freq: Four times a day (QID) | INTRAMUSCULAR | Status: DC | PRN
  Administered 2015-01-19: 4 mg via INTRAVENOUS
  Filled 2015-01-18: qty 2

## 2015-01-18 MED ORDER — IOHEXOL 300 MG/ML  SOLN
100.0000 mL | Freq: Once | INTRAMUSCULAR | Status: AC | PRN
  Administered 2015-01-18: 100 mL via INTRAVENOUS

## 2015-01-18 MED ORDER — HYDROMORPHONE HCL 1 MG/ML IJ SOLN
0.5000 mg | INTRAMUSCULAR | Status: DC | PRN
  Administered 2015-01-18 – 2015-01-21 (×12): 1 mg via INTRAVENOUS
  Filled 2015-01-18 (×13): qty 1

## 2015-01-18 MED ORDER — LAMOTRIGINE 100 MG PO TABS
100.0000 mg | ORAL_TABLET | Freq: Two times a day (BID) | ORAL | Status: DC
  Administered 2015-01-18 – 2015-01-22 (×7): 100 mg via ORAL
  Filled 2015-01-18 (×9): qty 1

## 2015-01-18 MED ORDER — GABAPENTIN 300 MG PO CAPS
600.0000 mg | ORAL_CAPSULE | Freq: Every day | ORAL | Status: DC
  Administered 2015-01-18 – 2015-01-21 (×4): 600 mg via ORAL
  Filled 2015-01-18 (×5): qty 2

## 2015-01-18 MED ORDER — IOHEXOL 300 MG/ML  SOLN
25.0000 mL | Freq: Once | INTRAMUSCULAR | Status: AC | PRN
  Administered 2015-01-18: 25 mL via ORAL

## 2015-01-18 MED ORDER — PIPERACILLIN-TAZOBACTAM 3.375 G IVPB 30 MIN
3.3750 g | Freq: Once | INTRAVENOUS | Status: AC
  Administered 2015-01-18: 3.375 g via INTRAVENOUS
  Filled 2015-01-18 (×2): qty 50

## 2015-01-18 MED ORDER — POTASSIUM CHLORIDE IN NACL 20-0.9 MEQ/L-% IV SOLN
INTRAVENOUS | Status: DC
  Administered 2015-01-18 – 2015-01-19 (×2): via INTRAVENOUS
  Filled 2015-01-18 (×4): qty 1000

## 2015-01-18 MED ORDER — ONDANSETRON HCL 4 MG/2ML IJ SOLN
4.0000 mg | INTRAMUSCULAR | Status: AC | PRN
  Administered 2015-01-18 (×2): 4 mg via INTRAVENOUS
  Filled 2015-01-18 (×2): qty 2

## 2015-01-18 MED ORDER — PIPERACILLIN-TAZOBACTAM 3.375 G IVPB
3.3750 g | Freq: Three times a day (TID) | INTRAVENOUS | Status: DC
  Administered 2015-01-18 – 2015-01-21 (×9): 3.375 g via INTRAVENOUS
  Filled 2015-01-18 (×9): qty 50

## 2015-01-18 MED ORDER — ONDANSETRON 4 MG PO TBDP
4.0000 mg | ORAL_TABLET | Freq: Four times a day (QID) | ORAL | Status: DC | PRN
  Administered 2015-01-18 – 2015-01-19 (×2): 4 mg via ORAL
  Filled 2015-01-18 (×2): qty 1

## 2015-01-18 NOTE — ED Provider Notes (Signed)
CSN: [id number]     Arrival date & time 01/18/15  0809 History   First MD Initiated Contact with Patient 01/18/15 [phone]     Chief Complaint  Patient presents with  . Abdominal Pain     (Consider location/radiation/quality/duration/timing/severity/associated sxs/prior Treatment) Patient is a 24 y.o. female presenting with abdominal pain. The history is provided by the patient.  Abdominal Pain Pain location: Started periumbilical and moved to right lower quadrant. Pain quality: aching   Pain radiates to:  Back Pain severity:  Moderate Onset quality:  Gradual Duration:  1 day Timing:  Constant Progression:  Unchanged Chronicity:  New Context comment:  Recent mild upper respiratory infection Relieved by:  Nothing Worsened by:  Nothing tried Ineffective treatments:  None tried Associated symptoms: cough, fever and nausea   Associated symptoms: no diarrhea, no hematochezia and no vomiting     Past Medical History  Diagnosis Date  . Migraines   . Allergy    Past Surgical History  Procedure Laterality Date  . Wisdom tooth extraction     Family History  Problem Relation Age of Onset  . Anemia Mother   . Hypertension Father   . Cancer Maternal Grandmother     unsure of type   Social History  Substance Use Topics  . Smoking status: Former Games developermoker  . Smokeless tobacco: Never Used  . Alcohol Use: 3.0 oz/week    5 Standard drinks or equivalent per week   OB History    No data available     Review of Systems  Constitutional: Positive for fever.  Respiratory: Positive for cough.   Gastrointestinal: Positive for nausea and abdominal pain. Negative for vomiting, diarrhea and hematochezia.      Allergies  Penicillins  Home Medications   Prior to Admission medications   Medication Sig Start Date End Date Taking? Authorizing Provider  lamoTRIgine (LAMICTAL) 100 MG tablet Take 100 mg by mouth daily.   Yes Historical Provider, MD  Gabapentin, Once-Daily, 600 MG TABS  Take 1 tablet by mouth daily.    Historical Provider, MD  Multiple Vitamins-Minerals (WOMENS MULTIVITAMIN PLUS PO) Take 1 tablet by mouth daily.    Historical Provider, MD  norethindrone (MICRONOR,CAMILA,ERRIN) 0.35 MG tablet Take 1 tablet by mouth daily.    Historical Provider, MD   BP 120/81 mmHg  Pulse 121  Temp(Src) 101.5 F (38.6 C) (Oral)  Resp 19  SpO2 98% Physical Exam  Constitutional: She is oriented to person, place, and time. She appears well-developed and well-nourished. No distress.  HENT:  Head: Normocephalic.  Eyes: Conjunctivae are normal.  Neck: Neck supple. No tracheal deviation present.  Cardiovascular: Regular rhythm and normal heart sounds.  Tachycardia present.   Pulmonary/Chest: Effort normal and breath sounds normal. No respiratory distress.  Abdominal: Soft. She exhibits no distension. There is tenderness in the right lower quadrant. There is guarding (voluntary) and tenderness at McBurney's point. There is no rebound and no CVA tenderness.    Positive Rovsing sign  Neurological: She is alert and oriented to person, place, and time.  Skin: Skin is warm and dry.  Psychiatric: She has a normal mood and affect.    ED Course  Procedures (including critical care time) CRITICAL CARE Performed by: Lyndal PulleyKnott, Rafi Kenneth Total critical care time: 30 minutes Critical care time was exclusive of separately billable procedures and treating other patients. Critical care was necessary to treat or prevent imminent or life-threatening deterioration. Critical care was time spent personally by me on the following activities: development  of treatment plan with patient and/or surrogate as well as nursing, discussions with consultants, evaluation of patient's response to treatment, examination of patient, obtaining history from patient or surrogate, ordering and performing treatments and interventions, ordering and review of laboratory studies, ordering and review of radiographic  studies, pulse oximetry and re-evaluation of patient's condition.   Labs Review Labs Reviewed  URINALYSIS, ROUTINE W REFLEX MICROSCOPIC (NOT AT [facility]) - Abnormal; Notable for the following:    APPearance CLOUDY (*)    Hgb urine dipstick TRACE (*)    Ketones, ur 15 (*)    Leukocytes, UA MODERATE (*)    All other components within normal limits  CBC WITH DIFFERENTIAL/PLATELET - Abnormal; Notable for the following:    WBC 12.6 (*)    Neutro Abs 10.1 (*)    Monocytes Absolute 1.5 (*)    All other components within normal limits  COMPREHENSIVE METABOLIC PANEL - Abnormal; Notable for the following:    Glucose, Bld 103 (*)    BUN 5 (*)    All other components within normal limits  LIPASE, BLOOD - Abnormal; Notable for the following:    Lipase 20 (*)    All other components within normal limits  URINE MICROSCOPIC-ADD ON - Abnormal; Notable for the following:    Squamous Epithelial / LPF FEW (*)    Bacteria, UA MANY (*)    All other components within normal limits  PREGNANCY, URINE  I-STAT CG4 LACTIC ACID, ED    Imaging Review Ct Abdomen Pelvis W Contrast  01/18/2015  CLINICAL DATA:  Right lower quadrant pain with nausea and fever for 2 days EXAM: CT ABDOMEN AND PELVIS WITH CONTRAST TECHNIQUE: Multidetector CT imaging of the abdomen and pelvis was performed using the standard protocol following bolus administration of intravenous contrast. Oral contrast was also administered. CONTRAST:  OMNIPAQUE IOHEXOL 300 MG/ML SOLN, 25mL OMNIPAQUE IOHEXOL 300 MG/ML SOLN COMPARISON:  None. FINDINGS: Lower chest: There is minimal atelectasis in the left base region. Lung bases are otherwise clear. Hepatobiliary: No focal liver lesions are identified. Gallbladder wall is not appreciably thickened. There is no biliary duct dilatation. Pancreas: No mass or inflammatory focus. Spleen: No splenic lesions identified. Adrenals/Urinary Tract: Adrenals appear normal bilaterally. Kidneys bilaterally show no  hydronephrosis on either side. There is a 1.3 x 1.2 cm probable mildly complex cyst in the medial mid right kidney. There is no renal or ureteral calculus on either side. Urinary bladder is midline with normal wall thickness. Stomach/Bowel: There is moderate stool in the colon. There is no bowel wall or mesenteric thickening. There is no bowel obstruction. No free air or portal venous air. Vascular/Lymphatic: There is no demonstrable abdominal aortic aneurysm. No vascular lesions are identified. There is no adenopathy in the abdomen or pelvis. Reproductive: Uterus is anteverted. There is no pelvic mass. There is free fluid in the cul-de-sac. A small amount of fluid is seen tracking into the right lower quadrant to the level of the appendix. Other: The appendix appears normal in size and configuration. A small amount of fluid is adjacent to the appendix, slightly complex in appearance. No abscess or periappendiceal mesenteric stranding seen. No abscess is seen elsewhere in the abdomen or pelvis. Musculoskeletal: No blastic or lytic bone lesions. No intramuscular or abdominal wall lesions. IMPRESSION: The appendix appears normal in size and contour. There is no surrounding mesenteric inflammation. A small amount of fluid is seen in the region the appendix which appears slightly complex. This fluid may well be  tracking from the pelvis to the appendix. This finding must be viewed, however, as somewhat equivocal for potential acute appendicitis. Early acute appendicitis is not excluded at this time. In this regard, close clinical and laboratory surveillance advised. If symptoms persist, reimaging of the pelvis with CT would be a reasonable consideration to assess for potential more direct signs of appendiceal inflammation. There is free fluid in the cul-de-sac. Suspect recent ovarian cyst rupture as the most likely etiology. Probable mildly complex cyst right kidney. No bowel obstruction.  No abscess. Critical  Value/emergent results were called by telephone at the time of interpretation on 01/18/2015 at 9:59 am to Dr. Lyndal Pulley , who verbally acknowledged these results. Dr. Clydene Pugh is concerned based on the patient's clinical symptoms that she indeed may have early acute appendicitis. It is entirely appropriate to consider surgical consultation at this time given the CT appearance and clinical symptoms with respect to the right lower quadrant findings. Electronically Signed   By: Bretta Bang III M.D.   On: 01/18/2015 10:00   I have personally reviewed and evaluated these images and lab results as part of my medical decision-making.   EKG Interpretation None      MDM   Final diagnoses:  Acute appendicitis with localized peritonitis  Sepsis, due to unspecified organism [facility])    24 year old female presents with periumbilical abdominal pain that started last night and has since migrated to the right lower quadrant of her abdomen that is highly concerning for acute appendicitis. Was preceded by mild upper respiratory symptoms. Is febrile and tachycardic on arrival but no evidence of shock currently. Patient with leukocytosis and equivocal findings with free fluid in the right lower quadrant and dependent pelvis on CT scan which would be consistent with early appendicitis given the clinical context. Gen. surgery consulted.  General surgery physician assistant recommending transfer to [facility] long for evaluation. Discussed with ED attending there Dr. Blinda Leatherwood who accepted the patient transfer.  Lyndal Pulley, MD 01/18/15 [phone]

## 2015-01-18 NOTE — ED Notes (Signed)
Patient c/o abd pain for the past two days, fever, congestion

## 2015-01-18 NOTE — Progress Notes (Signed)
Still uncomfortable, and a little tearful waiting to hear.  Discussed with Dr. Gerrit FriendsGerkin and he is going to watch her overnight.  Continue antibiotics exam in AM, labs in AM.  I have given her clears, and home Migraine medicines.

## 2015-01-18 NOTE — H&P (Signed)
Mia Baker is an 24 y.o. female.   Chief Complaint:  Pain and fever with congestion for 2 days HPI: Pt presents to the ED at [facility] this Am with abdominal pain starting in the periumbilical area and going to the RLQ.  She has had some nausea but no vomiting.  This pain is not like what she has with normal cycle.  Work up shows some fever up to 101.5 on admit.  Labs are normal except for a WBC of 12.6.  UA shows 11-20 WBC/HPF.  Pregnancy is negative.  CT scan shows:  The appendix appears normal in size and contour. There is no surrounding mesenteric inflammation. A small amount of fluid is seen in the region the appendix which appears slightly complex. This fluid may well be tracking from the pelvis to the appendix. This finding must be viewed, however, as somewhat equivocal for potential acute appendicitis. No bowel obstruction or abscess.  There is free fluid in the cul-de-sac. Suspect recent ovarian cyst rupture as the most likely etiology.  We are ask to see and she is transferred to [facility] for further evaluation.   Past Medical History  Diagnosis Date  . Migraines 3 times a week, but headaches daily   . Allergy     Past Surgical History  Procedure Laterality Date  . Wisdom tooth extraction      Family History  Problem Relation Age of Onset  . Anemia Mother   . Hypertension Father   . Cancer Maternal Grandmother     unsure of type   Social History:  reports that she has quit smoking. She has never used smokeless tobacco. She reports that she drinks about 3.0 oz of alcohol per week. She reports that she uses illicit drugs. Tobacco:  <1PPD for 7 years, quit 2 weeks ago ETOH:  8-10 drinks per weekend Drugs: marijuana, daily, helps her headaches  Allergies:  Allergies  Allergen Reactions  . Penicillins Other (See Comments)    Unsure of reaction, was as a Baker    Prior to Admission medications   Medication Sig Start Date End Date Taking? Authorizing Provider  lamoTRIgine  (LAMICTAL) 100 MG tablet Take 100 mg by mouth daily.   Yes Historical Provider, MD  Gabapentin, Once-Daily, 600 MG TABS Take 1 tablet by mouth daily.    Historical Provider, MD  Multiple Vitamins-Minerals (WOMENS MULTIVITAMIN PLUS PO) Take 1 tablet by mouth daily.    Historical Provider, MD  norethindrone (MICRONOR,CAMILA,ERRIN) 0.35 MG tablet Take 1 tablet by mouth daily.    Historical Provider, MD     Results for orders placed or performed during the hospital encounter of 01/18/15 (from the past 48 hour(s))  Urinalysis, Routine w reflex microscopic (not at [facility])     Status: Abnormal   Collection Time: 01/18/15  8:15 AM  Result Value Ref Range   Color, Urine YELLOW YELLOW   APPearance CLOUDY (A) CLEAR   Specific Gravity, Urine 1.006 1.005 - 1.030   pH 6.5 5.0 - 8.0   Glucose, UA NEGATIVE NEGATIVE mg/dL   Hgb urine dipstick TRACE (A) NEGATIVE   Bilirubin Urine NEGATIVE NEGATIVE   Ketones, ur 15 (A) NEGATIVE mg/dL   Protein, ur NEGATIVE NEGATIVE mg/dL   Urobilinogen, UA 0.2 0.0 - 1.0 mg/dL   Nitrite NEGATIVE NEGATIVE   Leukocytes, UA MODERATE (A) NEGATIVE  Pregnancy, urine     Status: None   Collection Time: 01/18/15  8:15 AM  Result Value Ref Range   Preg Test, Ur NEGATIVE  NEGATIVE  Urine microscopic-add on     Status: Abnormal   Collection Time: 01/18/15  8:15 AM  Result Value Ref Range   Squamous Epithelial / LPF FEW (A) RARE   WBC, UA 11-20 <3 WBC/hpf   RBC / HPF 0-2 <3 RBC/hpf   Bacteria, UA MANY (A) RARE  CBC with Differential/Platelet     Status: Abnormal   Collection Time: 01/18/15  9:00 AM  Result Value Ref Range   WBC 12.6 (H) 4.0 - 10.5 K/uL   RBC 3.91 3.87 - 5.11 MIL/uL   Hemoglobin 12.6 12.0 - 15.0 g/dL   HCT 37.0 36.0 - 46.0 %   MCV 94.6 78.0 - 100.0 fL   MCH 32.2 26.0 - 34.0 pg   MCHC 34.1 30.0 - 36.0 g/dL   RDW 12.5 11.5 - 15.5 %   Platelets 214 150 - 400 K/uL   Neutrophils Relative % 81 %   Neutro Abs 10.1 (H) 1.7 - 7.7 K/uL   Lymphocytes Relative 7 %    Lymphs Abs 0.9 0.7 - 4.0 K/uL   Monocytes Relative 12 %   Monocytes Absolute 1.5 (H) 0.1 - 1.0 K/uL   Eosinophils Relative 0 %   Eosinophils Absolute 0.0 0.0 - 0.7 K/uL   Basophils Relative 0 %   Basophils Absolute 0.0 0.0 - 0.1 K/uL  Comprehensive metabolic panel     Status: Abnormal   Collection Time: 01/18/15  9:00 AM  Result Value Ref Range   Sodium 137 135 - 145 mmol/L   Potassium 3.6 3.5 - 5.1 mmol/L   Chloride 104 101 - 111 mmol/L   CO2 26 22 - 32 mmol/L   Glucose, Bld 103 (H) 65 - 99 mg/dL   BUN 5 (L) 6 - 20 mg/dL   Creatinine, Ser 0.75 0.44 - 1.00 mg/dL   Calcium 9.0 8.9 - 10.3 mg/dL   Total Protein 6.7 6.5 - 8.1 g/dL   Albumin 3.6 3.5 - 5.0 g/dL   AST 17 15 - 41 U/L   ALT 15 14 - 54 U/L   Alkaline Phosphatase 46 38 - 126 U/L   Total Bilirubin 0.8 0.3 - 1.2 mg/dL   GFR calc non Af Amer >60 >60 mL/min   GFR calc Af Amer >60 >60 mL/min    Comment: (NOTE) The eGFR has been calculated using the CKD EPI equation. This calculation has not been validated in all clinical situations. eGFR's persistently <60 mL/min signify possible Chronic Kidney Disease.    Anion gap 7 5 - 15  Lipase, blood     Status: Abnormal   Collection Time: 01/18/15  9:00 AM  Result Value Ref Range   Lipase 20 (L) 22 - 51 U/L  I-Stat CG4 Lactic Acid, ED     Status: None   Collection Time: 01/18/15  9:11 AM  Result Value Ref Range   Lactic Acid, Venous 0.56 0.5 - 2.0 mmol/L   Ct Abdomen Pelvis W Contrast  01/18/2015  CLINICAL DATA:  Right lower quadrant pain with nausea and fever for 2 days EXAM: CT ABDOMEN AND PELVIS WITH CONTRAST TECHNIQUE: Multidetector CT imaging of the abdomen and pelvis was performed using the standard protocol following bolus administration of intravenous contrast. Oral contrast was also administered. CONTRAST:  191m OMNIPAQUE IOHEXOL 300 MG/ML SOLN, 286mOMNIPAQUE IOHEXOL 300 MG/ML SOLN COMPARISON:  None. FINDINGS: Lower chest: There is minimal atelectasis in the left base  region. Lung bases are otherwise clear. Hepatobiliary: No focal liver lesions are identified. Gallbladder  wall is not appreciably thickened. There is no biliary duct dilatation. Pancreas: No mass or inflammatory focus. Spleen: No splenic lesions identified. Adrenals/Urinary Tract: Adrenals appear normal bilaterally. Kidneys bilaterally show no hydronephrosis on either side. There is a 1.3 x 1.2 cm probable mildly complex cyst in the medial mid right kidney. There is no renal or ureteral calculus on either side. Urinary bladder is midline with normal wall thickness. Stomach/Bowel: There is moderate stool in the colon. There is no bowel wall or mesenteric thickening. There is no bowel obstruction. No free air or portal venous air. Vascular/Lymphatic: There is no demonstrable abdominal aortic aneurysm. No vascular lesions are identified. There is no adenopathy in the abdomen or pelvis. Reproductive: Uterus is anteverted. There is no pelvic mass. There is free fluid in the cul-de-sac. A small amount of fluid is seen tracking into the right lower quadrant to the level of the appendix. Other: The appendix appears normal in size and configuration. A small amount of fluid is adjacent to the appendix, slightly complex in appearance. No abscess or periappendiceal mesenteric stranding seen. No abscess is seen elsewhere in the abdomen or pelvis. Musculoskeletal: No blastic or lytic bone lesions. No intramuscular or abdominal wall lesions. IMPRESSION: The appendix appears normal in size and contour. There is no surrounding mesenteric inflammation. A small amount of fluid is seen in the region the appendix which appears slightly complex. This fluid may well be tracking from the pelvis to the appendix. This finding must be viewed, however, as somewhat equivocal for potential acute appendicitis. Early acute appendicitis is not excluded at this time. In this regard, close clinical and laboratory surveillance advised. If symptoms  persist, reimaging of the pelvis with CT would be a reasonable consideration to assess for potential more direct signs of appendiceal inflammation. There is free fluid in the cul-de-sac. Suspect recent ovarian cyst rupture as the most likely etiology. Probable mildly complex cyst right kidney. No bowel obstruction.  No abscess. Critical Value/emergent results were called by telephone at the time of interpretation on 01/18/2015 at 9:59 am to Dr. Leo Grosser , who verbally acknowledged these results. Dr. Laneta Simmers is concerned based on the patient's clinical symptoms that she indeed may have early acute appendicitis. It is entirely appropriate to consider surgical consultation at this time given the CT appearance and clinical symptoms with respect to the right lower quadrant findings. Electronically Signed   By: Lowella Grip III M.D.   On: 01/18/2015 10:00    Review of Systems  Constitutional: Positive for fever. Negative for chills, weight loss, malaise/fatigue and diaphoresis.  HENT: Negative for congestion, ear discharge, ear pain, hearing loss, nosebleeds, sore throat and tinnitus.   Respiratory: Negative.  Negative for stridor.   Cardiovascular: Negative.   Gastrointestinal: Positive for nausea (some nausea, no vomiting) and abdominal pain (started periumbilical area and is now in the RLQ.). Negative for diarrhea, constipation, blood in stool and melena.  Genitourinary: Positive for dysuria (last friday had some sx, but none over the weekend or now.). Negative for urgency, frequency, hematuria and flank pain.  Musculoskeletal: Negative.   Neurological: Positive for headaches (daily headaches, and migraines about 3 times per week).  Endo/Heme/Allergies: Negative.   Psychiatric/Behavioral: The patient is nervous/anxious.     Blood pressure 106/69, pulse 85, temperature 99.1 F (37.3 C), temperature source Oral, resp. rate 19, last menstrual period 01/01/2015, SpO2 98 %. Physical Exam   Constitutional: She is oriented to person, place, and time. She appears well-developed and well-nourished. No  distress.  She has a headache now, take gabapenitn and Lamictal for headaches.  HENT:  Head: Normocephalic and atraumatic.  Nose: Nose normal.  Eyes: Conjunctivae and EOM are normal. Right eye exhibits no discharge. Left eye exhibits no discharge. No scleral icterus.  Neck: Normal range of motion. Neck supple. No JVD present. No tracheal deviation present. No thyromegaly present.  Cardiovascular: Normal rate, regular rhythm, normal heart sounds and intact distal pulses.   No murmur heard. Respiratory: Effort normal and breath sounds normal. No respiratory distress. She has no wheezes. She has no rales. She exhibits no tenderness.  GI: Soft. She exhibits no distension and no mass. There is tenderness (Mostly in the RLQ). There is no rebound and no guarding.  Musculoskeletal: She exhibits no edema or tenderness.  Lymphadenopathy:    She has no cervical adenopathy.  Neurological: She is alert and oriented to person, place, and time. No cranial nerve deficit.  Skin: Skin is warm and dry. No rash noted. She is not diaphoretic. No erythema. No pallor.  Psychiatric: She has a normal mood and affect. Her behavior is normal. Judgment and thought content normal.     Assessment/Plan Abdominal pain, with nausea, probable appendicitis Migraines   Plan:  She was started on Zosyn, and has not had any issue with it so I will continue it.  Keep her NPO and review CT with Dr. Harlow Asa.    Dennard Vezina 01/18/2015, 12:41 PM

## 2015-01-18 NOTE — ED Notes (Signed)
Bed: [id number] Expected date:  Expected time:  Means of arrival:  Comments: 23yo- appendicitis/carelink

## 2015-01-18 NOTE — ED Provider Notes (Signed)
Medical screening exam:  Patient originally seen at [facility] for evaluation of abdominal pain, transferred here for evaluation for possible appendicitis. Patient started with periumbilical pain that migrated to the right lower quadrant. CT scan did not show inflammation of the appendix, but there is pelvic free fluid and this raises concern for possible early appendicitis. At arrival to the ER, pain is well-controlled. Will consult surgery to evaluate the patient.  Gilda Creasehristopher J Lerry Cordrey, MD 01/18/15 1246

## 2015-01-19 ENCOUNTER — Encounter (HOSPITAL_COMMUNITY): Admission: EM | Disposition: A | Discharge status: Home / Self Care | Payer: Self-pay | Source: Home / Self Care

## 2015-01-19 ENCOUNTER — Encounter (HOSPITAL_COMMUNITY): Payer: Self-pay | Admitting: Anesthesiology

## 2015-01-19 ENCOUNTER — Observation Stay (HOSPITAL_COMMUNITY): Payer: Managed Care, Other (non HMO) | Admitting: Anesthesiology

## 2015-01-19 DIAGNOSIS — R109 Unspecified abdominal pain: Secondary | ICD-10-CM | POA: Diagnosis present

## 2015-01-19 HISTORY — PX: LAPAROSCOPIC APPENDECTOMY: SHX408

## 2015-01-19 LAB — CBC
HEMATOCRIT: 34.5 % — AB (ref 36.0–46.0)
Hemoglobin: 11.4 g/dL — ABNORMAL LOW (ref 12.0–15.0)
MCH: 31.8 pg (ref 26.0–34.0)
MCHC: 33 g/dL (ref 30.0–36.0)
MCV: 96.1 fL (ref 78.0–100.0)
PLATELETS: 206 10*3/uL (ref 150–400)
RBC: 3.59 MIL/uL — AB (ref 3.87–5.11)
RDW: 12.8 % (ref 11.5–15.5)
WBC: 11.8 10*3/uL — ABNORMAL HIGH (ref 4.0–10.5)

## 2015-01-19 LAB — BASIC METABOLIC PANEL
Anion gap: 7 (ref 5–15)
CHLORIDE: 106 mmol/L (ref 101–111)
CO2: 24 mmol/L (ref 22–32)
CREATININE: 0.81 mg/dL (ref 0.44–1.00)
Calcium: 8.8 mg/dL — ABNORMAL LOW (ref 8.9–10.3)
GFR calc non Af Amer: >60 mL/min (ref >60)
Glucose, Bld: 85 mg/dL (ref 65–99)
POTASSIUM: 3.8 mmol/L (ref 3.5–5.1)
Sodium: 137 mmol/L (ref 135–145)

## 2015-01-19 LAB — SURGICAL PCR SCREEN
MRSA, PCR: NEGATIVE
Staphylococcus aureus: NEGATIVE

## 2015-01-19 SURGERY — APPENDECTOMY, LAPAROSCOPIC
Anesthesia: General | Site: Abdomen

## 2015-01-19 MED ORDER — MORPHINE SULFATE (PF) 10 MG/ML IV SOLN
INTRAVENOUS | Status: AC
  Filled 2015-01-19: qty 1

## 2015-01-19 MED ORDER — ONDANSETRON HCL 4 MG/2ML IJ SOLN
4.0000 mg | INTRAMUSCULAR | Status: DC | PRN
  Administered 2015-01-19 – 2015-01-21 (×4): 4 mg via INTRAVENOUS
  Filled 2015-01-19 (×3): qty 2

## 2015-01-19 MED ORDER — KETOROLAC TROMETHAMINE 30 MG/ML IJ SOLN
INTRAMUSCULAR | Status: AC
  Filled 2015-01-19: qty 1

## 2015-01-19 MED ORDER — BUPIVACAINE-EPINEPHRINE (PF) 0.25% -1:200000 IJ SOLN
INTRAMUSCULAR | Status: AC
  Filled 2015-01-19: qty 30

## 2015-01-19 MED ORDER — 0.9 % SODIUM CHLORIDE (POUR BTL) OPTIME
TOPICAL | Status: DC | PRN
  Administered 2015-01-19: 1000 mL

## 2015-01-19 MED ORDER — KCL IN DEXTROSE-NACL 20-5-0.45 MEQ/L-%-% IV SOLN
INTRAVENOUS | Status: DC
  Administered 2015-01-19 – 2015-01-21 (×2): via INTRAVENOUS
  Filled 2015-01-19 (×4): qty 1000

## 2015-01-19 MED ORDER — ACETAMINOPHEN 10 MG/ML IV SOLN
1000.0000 mg | Freq: Once | INTRAVENOUS | Status: AC
  Administered 2015-01-19: 1000 mg via INTRAVENOUS

## 2015-01-19 MED ORDER — PROPOFOL 10 MG/ML IV BOLUS
INTRAVENOUS | Status: DC | PRN
  Administered 2015-01-19: 130 mg via INTRAVENOUS

## 2015-01-19 MED ORDER — PROMETHAZINE HCL 25 MG/ML IJ SOLN: 6.2500 mg | INTRAMUSCULAR | Status: DC | PRN

## 2015-01-19 MED ORDER — SUCCINYLCHOLINE CHLORIDE 20 MG/ML IJ SOLN
INTRAMUSCULAR | Status: DC | PRN
  Administered 2015-01-19: 80 mg via INTRAVENOUS

## 2015-01-19 MED ORDER — PIPERACILLIN-TAZOBACTAM 3.375 G IVPB
INTRAVENOUS | Status: AC
  Filled 2015-01-19: qty 50

## 2015-01-19 MED ORDER — MORPHINE SULFATE (PF) 10 MG/ML IV SOLN
2.0000 mg | INTRAVENOUS | Status: DC | PRN
  Administered 2015-01-19 (×3): 2 mg via INTRAVENOUS

## 2015-01-19 MED ORDER — ONDANSETRON 4 MG PO TBDP
4.0000 mg | ORAL_TABLET | Freq: Four times a day (QID) | ORAL | Status: DC | PRN
  Administered 2015-01-19 – 2015-01-22 (×5): 4 mg via ORAL
  Filled 2015-01-19 (×5): qty 1

## 2015-01-19 MED ORDER — PROMETHAZINE HCL 25 MG/ML IJ SOLN: 12.5000 mg | INTRAMUSCULAR | Status: DC | PRN

## 2015-01-19 MED ORDER — ROCURONIUM BROMIDE 100 MG/10ML IV SOLN
INTRAVENOUS | Status: DC | PRN
  Administered 2015-01-19: 30 mg via INTRAVENOUS

## 2015-01-19 MED ORDER — GLYCOPYRROLATE 0.2 MG/ML IJ SOLN
INTRAMUSCULAR | Status: DC | PRN
  Administered 2015-01-19: 0.4 mg via INTRAVENOUS

## 2015-01-19 MED ORDER — MIDAZOLAM HCL 5 MG/5ML IJ SOLN
INTRAMUSCULAR | Status: DC | PRN
  Administered 2015-01-19: 2 mg via INTRAVENOUS

## 2015-01-19 MED ORDER — NEOSTIGMINE METHYLSULFATE 10 MG/10ML IV SOLN
INTRAVENOUS | Status: AC
  Filled 2015-01-19: qty 1

## 2015-01-19 MED ORDER — ACETAMINOPHEN 10 MG/ML IV SOLN
INTRAVENOUS | Status: AC
  Filled 2015-01-19: qty 100

## 2015-01-19 MED ORDER — MIDAZOLAM HCL 2 MG/2ML IJ SOLN
INTRAMUSCULAR | Status: AC
  Filled 2015-01-19: qty 4

## 2015-01-19 MED ORDER — HYDROCODONE-ACETAMINOPHEN 5-325 MG PO TABS
1.0000 | ORAL_TABLET | ORAL | Status: DC | PRN
  Administered 2015-01-20: 2 via ORAL
  Administered 2015-01-20: 1 via ORAL
  Administered 2015-01-20: 2 via ORAL
  Administered 2015-01-21 (×2): 1 via ORAL
  Administered 2015-01-21: 2 via ORAL
  Administered 2015-01-21: 1 via ORAL
  Administered 2015-01-21: 2 via ORAL
  Administered 2015-01-22: 1 via ORAL
  Administered 2015-01-22 (×2): 2 via ORAL
  Filled 2015-01-19 (×2): qty 2
  Filled 2015-01-19: qty 1
  Filled 2015-01-19 (×4): qty 2
  Filled 2015-01-19: qty 1
  Filled 2015-01-19 (×5): qty 2

## 2015-01-19 MED ORDER — FENTANYL CITRATE (PF) 100 MCG/2ML IJ SOLN
50.0000 ug | INTRAMUSCULAR | Status: AC | PRN
  Administered 2015-01-19 (×2): 100 ug via INTRAVENOUS
  Administered 2015-01-19 (×3): 50 ug via INTRAVENOUS

## 2015-01-19 MED ORDER — LACTATED RINGERS IV SOLN
INTRAVENOUS | Status: DC
  Administered 2015-01-19: 14:00:00 via INTRAVENOUS
  Administered 2015-01-19: 1000 mL via INTRAVENOUS

## 2015-01-19 MED ORDER — LIDOCAINE HCL (CARDIAC) 20 MG/ML IV SOLN
INTRAVENOUS | Status: DC | PRN
  Administered 2015-01-19: 50 mg via INTRAVENOUS

## 2015-01-19 MED ORDER — FENTANYL CITRATE (PF) 100 MCG/2ML IJ SOLN
INTRAMUSCULAR | Status: AC
  Filled 2015-01-19: qty 2

## 2015-01-19 MED ORDER — ONDANSETRON HCL 4 MG/2ML IJ SOLN
INTRAMUSCULAR | Status: AC
  Filled 2015-01-19: qty 2

## 2015-01-19 MED ORDER — LACTATED RINGERS IR SOLN
Status: DC | PRN
  Administered 2015-01-19: 1

## 2015-01-19 MED ORDER — PROPOFOL 10 MG/ML IV BOLUS
INTRAVENOUS | Status: AC
  Filled 2015-01-19: qty 20

## 2015-01-19 MED ORDER — INFLUENZA VAC SPLIT QUAD 0.5 ML IM SUSY: 0.5000 mL | PREFILLED_SYRINGE | INTRAMUSCULAR | Status: DC | PRN

## 2015-01-19 MED ORDER — BUPIVACAINE-EPINEPHRINE 0.25% -1:200000 IJ SOLN
INTRAMUSCULAR | Status: DC | PRN
  Administered 2015-01-19: 20 mL

## 2015-01-19 MED ORDER — NEOSTIGMINE METHYLSULFATE 10 MG/10ML IV SOLN
INTRAVENOUS | Status: DC | PRN
  Administered 2015-01-19: 3 mg via INTRAVENOUS

## 2015-01-19 MED ORDER — KETOROLAC TROMETHAMINE 30 MG/ML IJ SOLN
30.0000 mg | Freq: Once | INTRAMUSCULAR | Status: AC
  Administered 2015-01-19: 30 mg via INTRAVENOUS

## 2015-01-19 MED ORDER — ALPRAZOLAM 0.25 MG PO TABS
0.2500 mg | ORAL_TABLET | Freq: Once | ORAL | Status: AC
  Administered 2015-01-19: 0.25 mg via ORAL
  Filled 2015-01-19: qty 1

## 2015-01-19 MED ORDER — GLYCOPYRROLATE 0.2 MG/ML IJ SOLN
INTRAMUSCULAR | Status: AC
  Filled 2015-01-19: qty 2

## 2015-01-19 MED ORDER — DEXAMETHASONE SODIUM PHOSPHATE 10 MG/ML IJ SOLN
INTRAMUSCULAR | Status: DC | PRN
  Administered 2015-01-19: 10 mg via INTRAVENOUS

## 2015-01-19 MED ORDER — FENTANYL CITRATE (PF) 250 MCG/5ML IJ SOLN
INTRAMUSCULAR | Status: AC
  Filled 2015-01-19: qty 25

## 2015-01-19 MED ORDER — DEXAMETHASONE SODIUM PHOSPHATE 10 MG/ML IJ SOLN
INTRAMUSCULAR | Status: AC
  Filled 2015-01-19: qty 1

## 2015-01-19 SURGICAL SUPPLY — 39 items
APPLIER CLIP ROT 10 11.4 M/L (STAPLE)
BENZOIN TINCTURE PRP APPL 2/3 (GAUZE/BANDAGES/DRESSINGS) ×2 IMPLANT
CLIP APPLIE ROT 10 11.4 M/L (STAPLE) IMPLANT
CLOSURE STERI-STRIP 1/4X4 (GAUZE/BANDAGES/DRESSINGS) ×2 IMPLANT
COVER SURGICAL LIGHT HANDLE (MISCELLANEOUS) ×2 IMPLANT
CUTTER FLEX LINEAR 45M (STAPLE) ×2 IMPLANT
DECANTER SPIKE VIAL GLASS SM (MISCELLANEOUS) ×2 IMPLANT
DRAPE LAPAROSCOPIC ABDOMINAL (DRAPES) ×2 IMPLANT
ELECT PENCIL ROCKER SW 15FT (MISCELLANEOUS) IMPLANT
ELECT REM PT RETURN 9FT ADLT (ELECTROSURGICAL) ×2
ELECTRODE REM PT RTRN 9FT ADLT (ELECTROSURGICAL) ×1 IMPLANT
ENDOLOOP SUT PDS II  0 18 (SUTURE)
ENDOLOOP SUT PDS II 0 18 (SUTURE) IMPLANT
GAUZE SPONGE 2X2 8PLY STRL LF (GAUZE/BANDAGES/DRESSINGS) ×1 IMPLANT
GLOVE BIOGEL PI IND STRL 7.0 (GLOVE) ×1 IMPLANT
GLOVE BIOGEL PI INDICATOR 7.0 (GLOVE) ×1
GLOVE SURG ORTHO 8.0 STRL STRW (GLOVE) ×2 IMPLANT
GOWN STRL REUS W/TWL LRG LVL3 (GOWN DISPOSABLE) ×2 IMPLANT
GOWN STRL REUS W/TWL XL LVL3 (GOWN DISPOSABLE) ×4 IMPLANT
KIT BASIN OR (CUSTOM PROCEDURE TRAY) ×2 IMPLANT
POUCH SPECIMEN RETRIEVAL 10MM (ENDOMECHANICALS) ×2 IMPLANT
RELOAD 45 VASCULAR/THIN (ENDOMECHANICALS) IMPLANT
RELOAD STAPLE TA45 3.5 REG BLU (ENDOMECHANICALS) ×2 IMPLANT
SET IRRIG TUBING LAPAROSCOPIC (IRRIGATION / IRRIGATOR) ×2 IMPLANT
SHEARS HARMONIC ACE PLUS 36CM (ENDOMECHANICALS) IMPLANT
SOLUTION ANTI FOG 6CC (MISCELLANEOUS) ×2 IMPLANT
SPONGE GAUZE 2X2 STER 10/PKG (GAUZE/BANDAGES/DRESSINGS) ×1
STRIP CLOSURE SKIN 1/2X4 (GAUZE/BANDAGES/DRESSINGS) ×2 IMPLANT
SUT MNCRL AB 4-0 PS2 18 (SUTURE) ×2 IMPLANT
TAPE CLOTH SURG 4X10 WHT LF (GAUZE/BANDAGES/DRESSINGS) ×2 IMPLANT
TOWEL OR 17X26 10 PK STRL BLUE (TOWEL DISPOSABLE) ×2 IMPLANT
TRAY FOLEY W/METER SILVER 14FR (SET/KITS/TRAYS/PACK) ×2 IMPLANT
TRAY FOLEY W/METER SILVER 16FR (SET/KITS/TRAYS/PACK) ×2 IMPLANT
TRAY LAPAROSCOPIC (CUSTOM PROCEDURE TRAY) ×2 IMPLANT
TROCAR BLADELESS OPT 5 75 (ENDOMECHANICALS) ×2 IMPLANT
TROCAR XCEL BLUNT TIP 100MML (ENDOMECHANICALS) ×2 IMPLANT
TROCAR XCEL NON-BLD 11X100MML (ENDOMECHANICALS) ×2 IMPLANT
TUBING INSUFFLATION 10FT LAP (TUBING) ×2 IMPLANT
WATER STERILE IRR 1500ML POUR (IV SOLUTION) ×2 IMPLANT

## 2015-01-19 NOTE — Anesthesia Procedure Notes (Signed)
Procedure Name: Intubation Date/Time: 01/19/2015 12:30 PM Performed by: Paris LoreBLANTON, Mia Poulter M Pre-anesthesia Checklist: Patient identified, Emergency Drugs available, Suction available, Patient being monitored and Timeout performed Patient Re-evaluated:Patient Re-evaluated prior to inductionOxygen Delivery Method: Circle system utilized Preoxygenation: Pre-oxygenation with 100% oxygen Intubation Type: IV induction and Rapid sequence Laryngoscope Size: Mac and 4 Grade View: Grade I Tube type: Oral Tube size: 7.5 mm Number of attempts: 1 Airway Equipment and Method: Stylet Placement Confirmation: ETT inserted through vocal cords under direct vision,  positive ETCO2,  CO2 detector and breath sounds checked- equal and bilateral Secured at: 21 cm Tube secured with: Tape Dental Injury: Teeth and Oropharynx as per pre-operative assessment

## 2015-01-19 NOTE — Anesthesia Postprocedure Evaluation (Signed)
  Anesthesia Post-op Note  Patient: Mia Baker  Procedure(s) Performed: Procedure(s) (LRB): APPENDECTOMY LAPAROSCOPIC (N/A)  Patient Location: PACU  Anesthesia Type: General  Level of Consciousness: awake and alert   Airway and Oxygen Therapy: Patient Spontanous Breathing  Post-op Pain: mild  Post-op Assessment: Post-op Vital signs reviewed, Patient's Cardiovascular Status Stable, Respiratory Function Stable, Patent Airway and No signs of Nausea or vomiting  Last Vitals:  Filed Vitals:   01/19/15 1415  BP: 118/67  Pulse: 95  Temp:   Resp: 21    Post-op Vital Signs: stable   Complications: No apparent anesthesia complications

## 2015-01-19 NOTE — Op Note (Signed)
OPERATIVE REPORT - LAPAROSCOPIC APPENDECTOMY  Preop diagnosis:  Abdominal pain, rule out acute appendicitis  Postop diagnosis:  Negative diagnostic laparoscopy, grossly normal appendix  Procedure:  Diagnostic laparoscopy with laparoscopic appendectomy  Surgeon:  Velora Hecklerodd M. Angelli Baruch, MD, FACS  Anesthesia: General endotracheal  Estimated blood loss: Minimal  Preparation: Chlora-prep  Complications: None  Indications:  Patient presents to ER with RLQ abdominal pain, low-grade fever, and mild leukocytosis.  CT scan negative.  Symptoms persisted for >24 hours.  Therefore prepared for OR for laparoscopy and appendectomy.  Procedure:  Patient is brought to the operating room and placed in a supine position on the operating room table. Following administration of general anesthesia, a time out was held and the patient's name and procedure is confirmed. Patient is then prepped and draped in the usual strict aseptic fashion.  After ascertaining that an adequate level of anesthesia has been achieved, a peri-umbilical incision is made with a #15 blade. Dissection is carried down to the fascia. Fascia is incised in the midline and the peritoneal cavity is entered cautiously. A #0-vicryl pursestring suture is placed in the fascia. An Hassan cannula is introduced under direct vision and secured with the pursestring suture. The abdomen is insufflated with carbon dioxide. The laparoscope is introduced and the abdomen is explored. Operative ports are placed in the right upper quadrant and left lower quadrant. The appendix is identified. It appeared grossly normal.  The small bowel is normal without evidence of inflammatory bowel disease or Meckel's diverticulum.  Gallbladder is normal.  Stomach is normal.  Photographs were taken including the uterus and ovaries which appear normal.  There is a small amount of clear fluid in the pelvis which was evacuated.  Turning back to the appendix, the mesoappendix is divided  with the harmonic scalpel. Dissection is carried down to the base of the appendix. The base of the appendix is dissected out clearing the junction with the cecal wall. Using an Endo-GIA stapler, the base of the appendix is transected at the junction with the cecal wall. There is good approximation of tissue along the staple line. There is good hemostasis along the staple line. The appendix is placed into an endo-catch bag and withdrawn through the umbilical port. The #0-vicryl pursestring suture is tied securely.  Right lower quadrant is irrigated with warm saline which is evacuated. Good hemostasis is noted. Ports are removed under direct vision. Good hemostasis is noted at the port sites. Pneumoperitoneum is released.  Skin incisions are anesthetized with local anesthetic. Wounds are closed with interrupted 4-0 Monocryl subcuticular sutures. Wounds are washed and dried and benzoin and Steri-Strips are applied. Dressings are applied. The patient is awakened from anesthesia and brought to the recovery room. The patient tolerated the procedure well.  Velora Hecklerodd M. Vilma Will, MD, [facility] Office: [phone]

## 2015-01-19 NOTE — Progress Notes (Signed)
Patient ID: Mia PonderKarmen Baker, female   DOB: April 16, 1990, 24 y.o.   MRN: [id number]  [facility]  HD#: 2  Subjective: Patient with persistent RLQ pain, cramping.  Fever last night.  Objective: Vital signs in last 24 hours: Temp:  [98 F (36.7 C)-101.9 F (38.8 C)] 100.9 F (38.3 C) (10/14 0504) Pulse Rate:  [82-121] 94 (10/14 0504) Resp:  [18-20] 20 (10/14 0504) BP: (101-127)/(57-81) 127/75 mmHg (10/14 0504) SpO2:  [97 %-100 %] 100 % (10/14 0504) Weight:  [61.009 kg (134 lb 8 oz)] 61.009 kg (134 lb 8 oz) (10/13 1433)    Intake/Output from previous day: 10/13 0701 - 10/14 0700 In: 2190 [P.O.:480; I.V.:1610; IV Piggyback:100] Out: 2750 [Urine:2750] Intake/Output this shift:    Physical Exam: HEENT - sclerae clear, mucous membranes moist Neck - soft Chest - clear bilaterally Cor - RRR Abdomen - soft, scaphoid; tender RLQ with guarding Ext - no edema, non-tender Neuro - alert & oriented, no focal deficits  Lab Results:   Recent Labs  01/18/15 0900 01/19/15 0512  WBC 12.6* 11.8*  HGB 12.6 11.4*  HCT 37.0 34.5*  PLT 214 206   BMET  Recent Labs  01/18/15 0900 01/19/15 0512  NA 137 137  K 3.6 3.8  CL 104 106  CO2 26 24  GLUCOSE 103* 85  BUN 5* <5*  CREATININE 0.75 0.81  CALCIUM 9.0 8.8*   PT/INR No results for input(s): LABPROT, INR in the last 72 hours. Comprehensive Metabolic Panel:    Component Value Date/Time   NA 137 01/19/2015 0512   NA 137 01/18/2015 0900   K 3.8 01/19/2015 0512   K 3.6 01/18/2015 0900   CL 106 01/19/2015 0512   CL 104 01/18/2015 0900   CO2 24 01/19/2015 0512   CO2 26 01/18/2015 0900   BUN <5* 01/19/2015 0512   BUN 5* 01/18/2015 0900   CREATININE 0.81 01/19/2015 0512   CREATININE 0.75 01/18/2015 0900   GLUCOSE 85 01/19/2015 0512   GLUCOSE 103* 01/18/2015 0900   CALCIUM 8.8* 01/19/2015 0512   CALCIUM 9.0 01/18/2015 0900   AST 17 01/18/2015 0900   AST 27 08/09/2014 0841   ALT 15  01/18/2015 0900   ALT 32 08/09/2014 0841   ALKPHOS 46 01/18/2015 0900   ALKPHOS 62 08/09/2014 0841   BILITOT 0.8 01/18/2015 0900   BILITOT 0.5 08/09/2014 0841   PROT 6.7 01/18/2015 0900   PROT 7.2 08/09/2014 0841   ALBUMIN 3.6 01/18/2015 0900   ALBUMIN 4.2 08/09/2014 0841    Studies/Results: Ct Abdomen Pelvis W Contrast  01/18/2015  CLINICAL DATA:  Right lower quadrant pain with nausea and fever for 2 days EXAM: CT ABDOMEN AND PELVIS WITH CONTRAST TECHNIQUE: Multidetector CT imaging of the abdomen and pelvis was performed using the standard protocol following bolus administration of intravenous contrast. Oral contrast was also administered. CONTRAST:  100mL OMNIPAQUE IOHEXOL 300 MG/ML SOLN, 25mL OMNIPAQUE IOHEXOL 300 MG/ML SOLN COMPARISON:  None. FINDINGS: Lower chest: There is minimal atelectasis in the left base region. Lung bases are otherwise clear. Hepatobiliary: No focal liver lesions are identified. Gallbladder wall is not appreciably thickened. There is no biliary duct dilatation. Pancreas: No mass or inflammatory focus. Spleen: No splenic lesions identified. Adrenals/Urinary Tract: Adrenals appear normal bilaterally. Kidneys bilaterally show no hydronephrosis on either side. There is a 1.3 x 1.2 cm probable mildly complex cyst in the medial mid right kidney. There is no renal or ureteral calculus on either side. Urinary  bladder is midline with normal wall thickness. Stomach/Bowel: There is moderate stool in the colon. There is no bowel wall or mesenteric thickening. There is no bowel obstruction. No free air or portal venous air. Vascular/Lymphatic: There is no demonstrable abdominal aortic aneurysm. No vascular lesions are identified. There is no adenopathy in the abdomen or pelvis. Reproductive: Uterus is anteverted. There is no pelvic mass. There is free fluid in the cul-de-sac. A small amount of fluid is seen tracking into the right lower quadrant to the level of the appendix. Other: The  appendix appears normal in size and configuration. A small amount of fluid is adjacent to the appendix, slightly complex in appearance. No abscess or periappendiceal mesenteric stranding seen. No abscess is seen elsewhere in the abdomen or pelvis. Musculoskeletal: No blastic or lytic bone lesions. No intramuscular or abdominal wall lesions. IMPRESSION: The appendix appears normal in size and contour. There is no surrounding mesenteric inflammation. A small amount of fluid is seen in the region the appendix which appears slightly complex. This fluid may well be tracking from the pelvis to the appendix. This finding must be viewed, however, as somewhat equivocal for potential acute appendicitis. Early acute appendicitis is not excluded at this time. In this regard, close clinical and laboratory surveillance advised. If symptoms persist, reimaging of the pelvis with CT would be a reasonable consideration to assess for potential more direct signs of appendiceal inflammation. There is free fluid in the cul-de-sac. Suspect recent ovarian cyst rupture as the most likely etiology. Probable mildly complex cyst right kidney. No bowel obstruction.  No abscess. Critical Value/emergent results were called by telephone at the time of interpretation on 01/18/2015 at 9:59 am to Dr. Lyndal Pulley , who verbally acknowledged these results. Dr. Clydene Pugh is concerned based on the patient's clinical symptoms that she indeed may have early acute appendicitis. It is entirely appropriate to consider surgical consultation at this time given the CT appearance and clinical symptoms with respect to the right lower quadrant findings. Electronically Signed   By: Bretta Bang III M.D.   On: 01/18/2015 10:00    Anti-infectives: Anti-infectives    Start     Dose/Rate Route Frequency Ordered Stop   01/18/15 1800  piperacillin-tazobactam (ZOSYN) IVPB 3.375 g     3.375 g 12.5 mL/hr over 240 Minutes Intravenous Every 8 hours 01/18/15 1333      01/18/15 1330  piperacillin-tazobactam (ZOSYN) IVPB 3.375 g  Status:  Discontinued     3.375 g 12.5 mL/hr over 240 Minutes Intravenous 3 times per day 01/18/15 1323 01/18/15 1333   01/18/15 1015  piperacillin-tazobactam (ZOSYN) IVPB 3.375 g     3.375 g 100 mL/hr over 30 Minutes Intravenous  Once 01/18/15 1011 01/18/15 1113      Assessment & Plans: Persistent RLQ abdominal pain, fever - probable acute appendicitis  Plan laparoscopy and appendectomy this AM  Patient in agreement  The risks and benefits of the procedure have been discussed at length with the patient.  The patient understands the proposed procedure, potential alternative treatments, and the course of recovery to be expected.  All of the patient's questions have been answered at this time.  The patient wishes to proceed with surgery.   Velora Heckler, MD, [facility] Office: [phone]   Mia Baker Petit 01/19/2015

## 2015-01-19 NOTE — Anesthesia Preprocedure Evaluation (Signed)
Anesthesia Evaluation  Patient identified by MRN, date of birth, ID band Patient awake    Reviewed: Allergy & Precautions, H&P , NPO status , Patient's Chart, lab work & pertinent test results  History of Anesthesia Complications Negative for: history of anesthetic complications  Airway Mallampati: II  TM Distance: >3 FB Neck ROM: full    Dental no notable dental hx.    Pulmonary former smoker,    Pulmonary exam normal breath sounds clear to auscultation       Cardiovascular negative cardio ROS Normal cardiovascular exam Rhythm:regular Rate:Normal     Neuro/Psych negative neurological ROS     GI/Hepatic negative GI ROS, Neg liver ROS,   Endo/Other  negative endocrine ROS  Renal/GU negative Renal ROS     Musculoskeletal   Abdominal   Peds  Hematology negative hematology ROS (+)   Anesthesia Other Findings   Reproductive/Obstetrics negative OB ROS                             Anesthesia Physical Anesthesia Plan  ASA: II  Anesthesia Plan: General   Post-op Pain Management:    Induction: Intravenous and Rapid sequence  Airway Management Planned: Oral ETT  Additional Equipment: None  Intra-op Plan:   Post-operative Plan:   Informed Consent: I have reviewed the patients History and Physical, chart, labs and discussed the procedure including the risks, benefits and alternatives for the proposed anesthesia with the patient or authorized representative who has indicated his/her understanding and acceptance.   Dental Advisory Given  Plan Discussed with: Anesthesiologist, CRNA and Surgeon  Anesthesia Plan Comments: (RSI with ETT for acute appendicitis)        Anesthesia Quick Evaluation

## 2015-01-19 NOTE — Transfer of Care (Signed)
Immediate Anesthesia Transfer of Care Note  Patient: Mia Baker  Procedure(s) Performed: Procedure(s): APPENDECTOMY LAPAROSCOPIC (N/A)  Patient Location: PACU  Anesthesia Type:General  Level of Consciousness: awake, alert  and oriented  Airway & Oxygen Therapy: Patient Spontanous Breathing and Patient connected to face mask oxygen  Post-op Assessment: Report given to RN and Post -op Vital signs reviewed and stable  Post vital signs: Reviewed and stable  Last Vitals:  Filed Vitals:   01/19/15 1014  BP: 128/81  Pulse: 83  Temp: 37 C  Resp: 18    Complications: No apparent anesthesia complications

## 2015-01-20 DIAGNOSIS — B9620 Unspecified Escherichia coli [E. coli] as the cause of diseases classified elsewhere: Secondary | ICD-10-CM | POA: Diagnosis present

## 2015-01-20 DIAGNOSIS — K3580 Unspecified acute appendicitis: Secondary | ICD-10-CM | POA: Diagnosis present

## 2015-01-20 DIAGNOSIS — N390 Urinary tract infection, site not specified: Secondary | ICD-10-CM | POA: Diagnosis present

## 2015-01-20 DIAGNOSIS — G43709 Chronic migraine without aura, not intractable, without status migrainosus: Secondary | ICD-10-CM | POA: Diagnosis present

## 2015-01-20 DIAGNOSIS — R1031 Right lower quadrant pain: Secondary | ICD-10-CM

## 2015-01-20 DIAGNOSIS — Z87891 Personal history of nicotine dependence: Secondary | ICD-10-CM | POA: Diagnosis not present

## 2015-01-20 DIAGNOSIS — Z8249 Family history of ischemic heart disease and other diseases of the circulatory system: Secondary | ICD-10-CM | POA: Diagnosis not present

## 2015-01-20 DIAGNOSIS — Z809 Family history of malignant neoplasm, unspecified: Secondary | ICD-10-CM | POA: Diagnosis not present

## 2015-01-20 LAB — BASIC METABOLIC PANEL
ANION GAP: 10 (ref 5–15)
BUN: 5 mg/dL — AB (ref 6–20)
CO2: 26 mmol/L (ref 22–32)
Calcium: 9.5 mg/dL (ref 8.9–10.3)
Chloride: 102 mmol/L (ref 101–111)
Creatinine, Ser: 0.61 mg/dL (ref 0.44–1.00)
Glucose, Bld: 133 mg/dL — ABNORMAL HIGH (ref 65–99)
POTASSIUM: 4.7 mmol/L (ref 3.5–5.1)
SODIUM: 138 mmol/L (ref 135–145)

## 2015-01-20 LAB — URINE CULTURE

## 2015-01-20 LAB — CBC
HCT: 36.9 % (ref 36.0–46.0)
Hemoglobin: 12.4 g/dL (ref 12.0–15.0)
MCH: 31.6 pg (ref 26.0–34.0)
MCHC: 33.6 g/dL (ref 30.0–36.0)
MCV: 93.9 fL (ref 78.0–100.0)
PLATELETS: 235 10*3/uL (ref 150–400)
RBC: 3.93 MIL/uL (ref 3.87–5.11)
RDW: 12.1 % (ref 11.5–15.5)
WBC: 11.9 10*3/uL — AB (ref 4.0–10.5)

## 2015-01-20 MED ORDER — LIP MEDEX EX OINT
TOPICAL_OINTMENT | CUTANEOUS | Status: AC
  Administered 2015-01-20: 12:00:00
  Filled 2015-01-20: qty 7

## 2015-01-20 NOTE — Consult Note (Addendum)
[facility] Medical Consultation  Bedelia Pong QMV:[id number] DOB: [birth year] DOA: 01/18/2015 PCP: Piedad Climes, PA-C   Requesting physician: Dr. Darnell Level  Date of consultation: 01/20/2015 Reason for consultation: RLQ pain   Impression/Recommendations Active Problems:   RLQ pain - now status post appendectomy post op day #1 - unclear why pt still having pain, differential includes UTI/Cystitis, PID, ruptured ovarian cyst  - per GYN specialist, physical exam not consistent with PID so I did not repeat the exam - in PID, the pain is usually in lower quadrants and bilateral so I agree that is less likely here - however, CT scan did indicate recent ovarian cyst rupture and I wonder if that is contributing to some of her pain even though it does not look to impressive on the CT scan  - ovarian cyst rupture is more common on the right side (based on up to date review)  - her urine culture is positive for E. Coli and sensitive to Zosyn, so maybe component of cystitis also contributing to her pain - pt can be transitioned to oral ABX in AM - I agree with analgesia as needed, repeat CBC in AM - this was discussed with pt at bedside and I recommended outpatient follow up with her GYN in [place] if this pain persists   Ct Abdomen Pelvis W Contrast 01/18/2015 A small amount of fluid is seen in the region the appendix which appears slightly complex. This fluid may well be tracking from the pelvis to the appendix. This finding must be viewed, however, as somewhat equivocal for potential acute appendicitis. There is free fluid in the cul-de-sac. Suspect recent ovarian cyst rupture as the most likely etiology. Probable mildly complex cyst right kidney. No bowel obstruction.  No abscess.   I will followup again tomorrow. Please contact me if I can be of assistance in the meanwhile. Thank you for this consultation.  Debbora Presto, MD  [facility] Pager [phone] Cell  [phone]  If 7PM-7AM, please contact night-coverage [email or web address] Password [email or web address]   Chief Complaint: RLQ pain   HPI:  Pt is 24 yo female with no specific past medical history, admitted 10/13 for evaluation of persistent RLQ pain. Initial imaging studies were notable for some pelvic fluid and potential acute appendicitis. Pt underwent appendectomy 10/14 with no post op complications but pain is still present and pt reports it is intermittent and 7/10 in severity when present, no specific aggravating factors but occasionally worse with movement. PT also reports that pain occasionally but not consistently radiates across the lower abd area to the left side. Pt reports pain is alleviated with oral and IV analgesia provided in the hospital. Pt denies fevers, chills, no similar events in the past. Pt also denies history of ectopic pregnancy or STD.    Review of Systems:  Review of Systems  Constitutional: Negative for fever and chills.  Eyes: Negative for double vision and photophobia.  Respiratory: Negative for cough and sputum production.   Cardiovascular: Negative for chest pain, palpitations, leg swelling and PND.  Gastrointestinal: Positive for nausea and abdominal pain. Negative for heartburn, diarrhea, constipation and blood in stool.  Genitourinary: Negative for dysuria, urgency, frequency and hematuria.  Musculoskeletal: Negative for myalgias and neck pain.  Skin: Negative for itching and rash.  Neurological: Negative for dizziness, tingling, tremors, focal weakness and headaches.  Endo/Heme/Allergies: Negative for environmental allergies. Does not bruise/bleed easily.  Psychiatric/Behavioral: Negative for depression, suicidal ideas and substance abuse.    Past Medical History  Diagnosis Date  . Migraines   . Allergy    Past Surgical History  Procedure Laterality Date  . Wisdom tooth extraction     Social History:  reports that she has quit smoking. She has never used  smokeless tobacco. She reports that she drinks about 3.0 oz of alcohol per week. She reports that she uses illicit drugs.  Allergies  Allergen Reactions  . Penicillins Other (See Comments)    Tolerated Zosyn Has patient had a PCN reaction causing immediate rash, facial/tongue/throat swelling, SOB or lightheadedness with hypotension: No Has patient had a PCN reaction causing severe rash involving mucus membranes or skin necrosis: No Has patient had a PCN reaction that required hospitalization No Has patient had a PCN reaction occurring within the last 10 years: No If all of the above answers are "NO", then may proceed with Cephalosporin use.Unsure of reaction, was as a child   Family History  Problem Relation Age of Onset  . Anemia Mother   . Hypertension Father   . Cancer Maternal Grandmother     unsure of type    Prior to Admission medications   Medication Sig Start Date End Date Taking? Authorizing Provider  DM-Doxylamine-Acetaminophen (NYQUIL COLD & FLU PO) Take 30 mLs by mouth at bedtime as needed (cold/flu).   Yes Historical Provider, MD  gabapentin (NEURONTIN) 600 MG tablet Take 600 mg by mouth at bedtime.   Yes Historical Provider, MD  lamoTRIgine (LAMICTAL) 100 MG tablet Take 100 mg by mouth 2 (two) times daily.    Yes Historical Provider, MD  norethindrone (MICRONOR,CAMILA,ERRIN) 0.35 MG tablet Take 1 tablet by mouth daily.   Yes Historical Provider, MD   Physical Exam: Blood pressure 118/63, pulse 72, temperature 98.2 F (36.8 C), temperature source Oral, resp. rate 14, height  (1.702 m), weight 61.009 kg (134 lb 8 oz), last menstrual period 01/01/2015, SpO2 100 %. Filed Vitals:   01/20/15 1422  BP: 118/63  Pulse: 72  Temp: 98.2 F (36.8 C)  Resp: 14   Physical Exam  Constitutional: Appears well-developed and well-nourished. No distress.  HENT: Normocephalic. External right and left ear normal. Oropharynx is clear and moist.  Eyes: Conjunctivae and EOM are  normal. PERRLA, no scleral icterus.  Neck: Normal ROM. Neck supple. No JVD. No tracheal deviation. No thyromegaly.  CVS: RRR, S1/S2 +, no murmurs, no gallops, no carotid bruit.  Pulmonary: Effort and breath sounds normal, no stridor, rhonchi, wheezes, rales.  Abdominal: Soft. BS +,  no distension, tenderness in abd quadrants R > L, no rebound or guarding.  Musculoskeletal: Normal range of motion. No edema and no tenderness.  Lymphadenopathy: No lymphadenopathy noted, cervical, inguinal. Neuro: Alert. Normal reflexes, muscle tone coordination. No cranial nerve deficit. Skin: Skin is warm and dry. No rash noted. Not diaphoretic. No erythema. No pallor.  Psychiatric: Normal mood and affect. Behavior, judgment, thought content normal.     Labs on Admission:  Basic Metabolic Panel:  Recent Labs Lab 01/18/15 0900 01/19/15 0512 01/20/15 0500  NA 137 137 138  K 3.6 3.8 4.7  CL 104 106 102  CO2 GLUCOSE 103* 85 133*  BUN 5* <5* 5*  CREATININE 0.75 0.81 0.61  CALCIUM 9.0 8.8* 9.5   Liver Function Tests:  Recent Labs Lab 01/18/15 0900  AST 17  ALT 15  ALKPHOS 46  BILITOT 0.8  PROT 6.7  ALBUMIN 3.6    Recent Labs Lab 01/18/15 0900  LIPASE 20*   CBC:  Recent Labs Lab 01/18/15 0900 01/19/15 0512 01/20/15 0500  WBC 12.6* 11.8* 11.9*  NEUTROABS 10.1*  --   --   HGB 12.6 11.4* 12.4  HCT 37.0 34.5* 36.9  MCV 94.6 96.1 93.9  PLT 214 206 235   EKG: not done   Time spent: 45 minutes   MAGICK-Jeremias Broyhill [facility] Pager [phone]  If 7PM-7AM, please contact night-coverage [email or web address] Password [email or web address] 01/20/2015, 3:33 PM

## 2015-01-20 NOTE — Progress Notes (Addendum)
Patient ID: Elisha PonderKarmen Guilyard, female   DOB: November 30, 1990, 24 y.o.   MRN: [id number]  General Surgery [facility]  POD#: 1  Subjective: Patient with persistent RLQ pain, body aches, nausea.  Gynecology evaluated without significant findings per patient.  Objective: Vital signs in last 24 hours: Temp:  [97.5 F (36.4 C)-98.9 F (37.2 C)] 98.1 F (36.7 C) (10/15 96290632) Pulse Rate:  [69-110] 74 (10/15 0632) Resp:  [7-24] 16 (10/15 0632) BP: (106-144)/(47-81) 111/68 mmHg (10/15 0632) SpO2:  [99 %-100 %] 100 % (10/15 52840632)    Intake/Output from previous day: 10/14 0701 - 10/15 0700 In: 1478.3 [P.O.:120; I.V.:1108.3; IV Piggyback:250] Out: 2560 [Urine:2550; Blood:10] Intake/Output this shift:    Physical Exam: HEENT - sclerae clear, mucous membranes moist Neck - soft Chest - clear bilaterally Cor - RRR Abdomen - soft, scaphoid; dressings dry and intact Ext - no edema, non-tender Neuro - alert & oriented, no focal deficits  Lab Results:   Recent Labs  01/19/15 0512 01/20/15 0500  WBC 11.8* 11.9*  HGB 11.4* 12.4  HCT 34.5* 36.9  PLT 206 235   BMET  Recent Labs  01/19/15 0512 01/20/15 0500  NA 137 138  K 3.8 4.7  CL 106 102  CO2 24 26  GLUCOSE 85 133*  BUN <5* 5*  CREATININE 0.81 0.61  CALCIUM 8.8* 9.5   PT/INR No results for input(s): LABPROT, INR in the last 72 hours. Comprehensive Metabolic Panel:    Component Value Date/Time   NA 138 01/20/2015 0500   NA 137 01/19/2015 0512   K 4.7 01/20/2015 0500   K 3.8 01/19/2015 0512   CL 102 01/20/2015 0500   CL 106 01/19/2015 0512   CO2 26 01/20/2015 0500   CO2 24 01/19/2015 0512   BUN 5* 01/20/2015 0500   BUN <5* 01/19/2015 0512   CREATININE 0.61 01/20/2015 0500   CREATININE 0.81 01/19/2015 0512   GLUCOSE 133* 01/20/2015 0500   GLUCOSE 85 01/19/2015 0512   CALCIUM 9.5 01/20/2015 0500   CALCIUM 8.8* 01/19/2015 0512   AST 17 01/18/2015 0900   AST 27 08/09/2014 0841   ALT 15  01/18/2015 0900   ALT 32 08/09/2014 0841   ALKPHOS 46 01/18/2015 0900   ALKPHOS 62 08/09/2014 0841   BILITOT 0.8 01/18/2015 0900   BILITOT 0.5 08/09/2014 0841   PROT 6.7 01/18/2015 0900   PROT 7.2 08/09/2014 0841   ALBUMIN 3.6 01/18/2015 0900   ALBUMIN 4.2 08/09/2014 0841    Studies/Results: Ct Abdomen Pelvis W Contrast  01/18/2015  CLINICAL DATA:  Right lower quadrant pain with nausea and fever for 2 days EXAM: CT ABDOMEN AND PELVIS WITH CONTRAST TECHNIQUE: Multidetector CT imaging of the abdomen and pelvis was performed using the standard protocol following bolus administration of intravenous contrast. Oral contrast was also administered. CONTRAST:  100mL OMNIPAQUE IOHEXOL 300 MG/ML SOLN, 25mL OMNIPAQUE IOHEXOL 300 MG/ML SOLN COMPARISON:  None. FINDINGS: Lower chest: There is minimal atelectasis in the left base region. Lung bases are otherwise clear. Hepatobiliary: No focal liver lesions are identified. Gallbladder wall is not appreciably thickened. There is no biliary duct dilatation. Pancreas: No mass or inflammatory focus. Spleen: No splenic lesions identified. Adrenals/Urinary Tract: Adrenals appear normal bilaterally. Kidneys bilaterally show no hydronephrosis on either side. There is a 1.3 x 1.2 cm probable mildly complex cyst in the medial mid right kidney. There is no renal or ureteral calculus on either side. Urinary bladder is midline with normal wall thickness. Stomach/Bowel: There  is moderate stool in the colon. There is no bowel wall or mesenteric thickening. There is no bowel obstruction. No free air or portal venous air. Vascular/Lymphatic: There is no demonstrable abdominal aortic aneurysm. No vascular lesions are identified. There is no adenopathy in the abdomen or pelvis. Reproductive: Uterus is anteverted. There is no pelvic mass. There is free fluid in the cul-de-sac. A small amount of fluid is seen tracking into the right lower quadrant to the level of the appendix. Other: The  appendix appears normal in size and configuration. A small amount of fluid is adjacent to the appendix, slightly complex in appearance. No abscess or periappendiceal mesenteric stranding seen. No abscess is seen elsewhere in the abdomen or pelvis. Musculoskeletal: No blastic or lytic bone lesions. No intramuscular or abdominal wall lesions. IMPRESSION: The appendix appears normal in size and contour. There is no surrounding mesenteric inflammation. A small amount of fluid is seen in the region the appendix which appears slightly complex. This fluid may well be tracking from the pelvis to the appendix. This finding must be viewed, however, as somewhat equivocal for potential acute appendicitis. Early acute appendicitis is not excluded at this time. In this regard, close clinical and laboratory surveillance advised. If symptoms persist, reimaging of the pelvis with CT would be a reasonable consideration to assess for potential more direct signs of appendiceal inflammation. There is free fluid in the cul-de-sac. Suspect recent ovarian cyst rupture as the most likely etiology. Probable mildly complex cyst right kidney. No bowel obstruction.  No abscess. Critical Value/emergent results were called by telephone at the time of interpretation on 01/18/2015 at 9:59 am to Dr. Lyndal Pulley , who verbally acknowledged these results. Dr. Clydene Pugh is concerned based on the patient's clinical symptoms that she indeed may have early acute appendicitis. It is entirely appropriate to consider surgical consultation at this time given the CT appearance and clinical symptoms with respect to the right lower quadrant findings. Electronically Signed   By: Bretta Bang III M.D.   On: 01/18/2015 10:00    Anti-infectives: Anti-infectives    Start     Dose/Rate Route Frequency Ordered Stop   01/18/15 1800  piperacillin-tazobactam (ZOSYN) IVPB 3.375 g     3.375 g 12.5 mL/hr over 240 Minutes Intravenous Every 8 hours 01/18/15 1333      01/18/15 1330  piperacillin-tazobactam (ZOSYN) IVPB 3.375 g  Status:  Discontinued     3.375 g 12.5 mL/hr over 240 Minutes Intravenous 3 times per day 01/18/15 1323 01/18/15 1333   01/18/15 1015  piperacillin-tazobactam (ZOSYN) IVPB 3.375 g     3.375 g 100 mL/hr over 30 Minutes Intravenous  Once 01/18/15 1011 01/18/15 1113      Assessment & Plans: RLQ abdominal pain, leukocytosis, fever  Negative appendectomy, no other acute findings intra-abdominally  IV Zosyn empirically  Requiring IV narcotics for pain control  UTI - E.coli  Should be adequately covered with Zosyn  Will ask [facility] to evaluate.  Patient and husband frustrated.  No obvious source of pain and elevated WBC.  Velora Heckler, MD, [facility] Office: [phone]   Adalena Abdulla Judie Petit 01/20/2015

## 2015-01-20 NOTE — Consult Note (Signed)
Reason for Consult:pelvic pain , s/p appendectomy with negative findings Referring Physician: Dr. Festus Barren is an 24 y.o. female. No obstetric history on file.G0, Patient's last menstrual period was 01/01/2015. Regular menses on OCP prescribed by Dr Posey Pronto in [facility]. She had increasing RLQ pain and presented to [facility], sent ot [facility] with suspicion for appendicitis. After observation on ABX her pain did not improve and WBC was elevated. CT showed some pelvic fluid but no masses. Appendectomy performed by Dr Harlow Asa yesterday although there were no positive findings   Pertinent Gynecological History: Menses: flow is light and regular every month without intermenstrual spotting Bleeding: none currently Contraception: OCP (estrogen/progesterone) DES exposure: denies Blood transfusions: none Sexually transmitted diseases: no past history Previous GYN Procedures: no  Last mammogram: n/a  Last pap: normal Date: 2016 OB History: G0, P0   Menstrual History:  Patient's last menstrual period was 01/01/2015.    Past Medical History  Diagnosis Date  . Migraines   . Allergy     Past Surgical History  Procedure Laterality Date  . Wisdom tooth extraction      Family History  Problem Relation Age of Onset  . Anemia Mother   . Hypertension Father   . Cancer Maternal Grandmother     unsure of type    Social History:  reports that she has quit smoking. She has never used smokeless tobacco. She reports that she drinks about 3.0 oz of alcohol per week. She reports that she uses illicit drugs.  Allergies:  Allergies  Allergen Reactions  . Penicillins Other (See Comments)    Has patient had a PCN reaction causing immediate rash, facial/tongue/throat swelling, SOB or lightheadedness with hypotension: No Has patient had a PCN reaction causing severe rash involving mucus membranes or skin necrosis: No Has patient had a PCN reaction that required hospitalization No Has patient had  a PCN reaction occurring within the last 10 years: No If all of the above answers are "NO", then may proceed with Cephalosporin use.Unsure of reaction, was as a child    Medications: I have reviewed the patient's current medications.  ROS  Blood pressure 111/68, pulse 74, temperature 98.1 F (36.7 C), temperature source Oral, resp. rate 16, height '5\' 7"'  (1.702 m), weight 61.009 kg (134 lb 8 oz), last menstrual period 01/01/2015, SpO2 100 %. Physical Exam  NAD Abdomen: mild tender in RLQ, dressings dry and intact no mass Pelvic: external nl, slight white discharge, no CMT, uterus no mass Results for orders placed or performed during the hospital encounter of 01/18/15 (from the past 48 hour(s))  Basic metabolic panel     Status: Abnormal   Collection Time: 01/19/15  5:12 AM  Result Value Ref Range   Sodium 137 135 - 145 mmol/L   Potassium 3.8 3.5 - 5.1 mmol/L   Chloride 106 101 - 111 mmol/L   CO2 24 22 - 32 mmol/L   Glucose, Bld 85 65 - 99 mg/dL   BUN <5 (L) 6 - 20 mg/dL   Creatinine, Ser 0.81 0.44 - 1.00 mg/dL   Calcium 8.8 (L) 8.9 - 10.3 mg/dL   GFR calc non Af Amer >60 >60 mL/min   GFR calc Af Amer >60 >60 mL/min    Comment: (NOTE) The eGFR has been calculated using the CKD EPI equation. This calculation has not been validated in all clinical situations. eGFR's persistently <60 mL/min signify possible Chronic Kidney Disease.    Anion gap 7 5 - 15  CBC  Status: Abnormal   Collection Time: 01/19/15  5:12 AM  Result Value Ref Range   WBC 11.8 (H) 4.0 - 10.5 K/uL   RBC 3.59 (L) 3.87 - 5.11 MIL/uL   Hemoglobin 11.4 (L) 12.0 - 15.0 g/dL   HCT 34.5 (L) 36.0 - 46.0 %   MCV 96.1 78.0 - 100.0 fL   MCH 31.8 26.0 - 34.0 pg   MCHC 33.0 30.0 - 36.0 g/dL   RDW 12.8 11.5 - 15.5 %   Platelets 206 150 - 400 K/uL  Surgical pcr screen     Status: None   Collection Time: 01/19/15  8:37 AM  Result Value Ref Range   MRSA, PCR NEGATIVE NEGATIVE   Staphylococcus aureus NEGATIVE NEGATIVE     Comment:        The Xpert SA Assay (FDA approved for NASAL specimens in patients over 25 years of age), is one component of a comprehensive surveillance program.  Test performance has been validated by [facility] for patients greater than or equal to 57 year old. It is not intended to diagnose infection nor to guide or monitor treatment.   CBC     Status: Abnormal   Collection Time: 01/20/15  5:00 AM  Result Value Ref Range   WBC 11.9 (H) 4.0 - 10.5 K/uL   RBC 3.93 3.87 - 5.11 MIL/uL   Hemoglobin 12.4 12.0 - 15.0 g/dL   HCT 36.9 36.0 - 46.0 %   MCV 93.9 78.0 - 100.0 fL   MCH 31.6 26.0 - 34.0 pg   MCHC 33.6 30.0 - 36.0 g/dL   RDW 12.1 11.5 - 15.5 %   Platelets 235 150 - 400 K/uL  Basic metabolic panel     Status: Abnormal   Collection Time: 01/20/15  5:00 AM  Result Value Ref Range   Sodium 138 135 - 145 mmol/L   Potassium 4.7 3.5 - 5.1 mmol/L    Comment: RESULT REPEATED AND VERIFIED DELTA CHECK NOTED    Chloride 102 101 - 111 mmol/L   CO2 26 22 - 32 mmol/L   Glucose, Bld 133 (H) 65 - 99 mg/dL   BUN 5 (L) 6 - 20 mg/dL   Creatinine, Ser 0.61 0.44 - 1.00 mg/dL   Calcium 9.5 8.9 - 10.3 mg/dL   GFR calc non Af Amer >60 >60 mL/min   GFR calc Af Amer >60 >60 mL/min    Comment: (NOTE) The eGFR has been calculated using the CKD EPI equation. This calculation has not been validated in all clinical situations. eGFR's persistently <60 mL/min signify possible Chronic Kidney Disease.    Anion gap 10 5 - 15   CLINICAL DATA: Right lower quadrant pain with nausea and fever for 2 days  EXAM: CT ABDOMEN AND PELVIS WITH CONTRAST  TECHNIQUE: Multidetector CT imaging of the abdomen and pelvis was performed using the standard protocol following bolus administration of intravenous contrast. Oral contrast was also administered.  CONTRAST: 156m OMNIPAQUE IOHEXOL 300 MG/ML SOLN, 257mOMNIPAQUE IOHEXOL 300 MG/ML SOLN  COMPARISON: None.  FINDINGS: Lower chest: There  is minimal atelectasis in the left base region. Lung bases are otherwise clear.  Hepatobiliary: No focal liver lesions are identified. Gallbladder wall is not appreciably thickened. There is no biliary duct dilatation.  Pancreas: No mass or inflammatory focus.  Spleen: No splenic lesions identified.  Adrenals/Urinary Tract: Adrenals appear normal bilaterally. Kidneys bilaterally show no hydronephrosis on either side. There is a 1.3 x 1.2 cm probable mildly complex cyst in  the medial mid right kidney. There is no renal or ureteral calculus on either side. Urinary bladder is midline with normal wall thickness.  Stomach/Bowel: There is moderate stool in the colon. There is no bowel wall or mesenteric thickening. There is no bowel obstruction. No free air or portal venous air.  Vascular/Lymphatic: There is no demonstrable abdominal aortic aneurysm. No vascular lesions are identified. There is no adenopathy in the abdomen or pelvis.  Reproductive: Uterus is anteverted. There is no pelvic mass. There is free fluid in the cul-de-sac. A small amount of fluid is seen tracking into the right lower quadrant to the level of the appendix.  Other: The appendix appears normal in size and configuration. A small amount of fluid is adjacent to the appendix, slightly complex in appearance. No abscess or periappendiceal mesenteric stranding seen. No abscess is seen elsewhere in the abdomen or pelvis.  Musculoskeletal: No blastic or lytic bone lesions. No intramuscular or abdominal wall lesions.  IMPRESSION: The appendix appears normal in size and contour. There is no surrounding mesenteric inflammation. A small amount of fluid is seen in the region the appendix which appears slightly complex. This fluid may well be tracking from the pelvis to the appendix. This finding must be viewed, however, as somewhat equivocal for potential acute appendicitis. Early acute appendicitis is not  excluded at this time. In this regard, close clinical and laboratory surveillance advised. If symptoms persist, reimaging of the pelvis with CT would be a reasonable consideration to assess for potential more direct signs of appendiceal inflammation.  There is free fluid in the cul-de-sac. Suspect recent ovarian cyst rupture as the most likely etiology.  Probable mildly complex cyst right kidney.  No bowel obstruction. No abscess.  Critical Value/emergent results were called by telephone at the time of interpretation on 01/18/2015 at 9:59 am to Dr. Leo Grosser , who verbally acknowledged these results. Dr. Laneta Simmers is concerned based on the patient's clinical symptoms that she indeed may have early acute appendicitis. It is entirely appropriate to consider surgical consultation at this time given the CT appearance and clinical symptoms with respect to the right lower quadrant findings.   Electronically Signed  By: Lowella Grip III M.D.  On: 01/18/2015 10:00          Assessment/Plan: Etiology of pain is uncertain as her laparoscopy was negative. No evidence of PID with no salpingitis seen.  Recommend continue antibiotic and follow for clinical improvement.   ARNOLD,JAMES 01/20/2015

## 2015-01-21 LAB — BASIC METABOLIC PANEL
Anion gap: 5 (ref 5–15)
BUN: 6 mg/dL (ref 6–20)
CHLORIDE: 105 mmol/L (ref 101–111)
CO2: 29 mmol/L (ref 22–32)
CREATININE: 0.83 mg/dL (ref 0.44–1.00)
Calcium: 9 mg/dL (ref 8.9–10.3)
GFR calc non Af Amer: >60 mL/min (ref >60)
Glucose, Bld: 97 mg/dL (ref 65–99)
Potassium: 4.8 mmol/L (ref 3.5–5.1)
SODIUM: 139 mmol/L (ref 135–145)

## 2015-01-21 LAB — CBC
HCT: 34.2 % — ABNORMAL LOW (ref 36.0–46.0)
HEMOGLOBIN: 11.5 g/dL — AB (ref 12.0–15.0)
MCH: 32 pg (ref 26.0–34.0)
MCHC: 33.6 g/dL (ref 30.0–36.0)
MCV: 95.3 fL (ref 78.0–100.0)
Platelets: 282 10*3/uL (ref 150–400)
RBC: 3.59 MIL/uL — AB (ref 3.87–5.11)
RDW: 12.6 % (ref 11.5–15.5)
WBC: 9.5 10*3/uL (ref 4.0–10.5)

## 2015-01-21 MED ORDER — CIPROFLOXACIN HCL 500 MG PO TABS
500.0000 mg | ORAL_TABLET | Freq: Two times a day (BID) | ORAL | Status: DC
  Administered 2015-01-21 – 2015-01-22 (×2): 500 mg via ORAL
  Filled 2015-01-21 (×4): qty 1

## 2015-01-21 NOTE — Progress Notes (Signed)
Patient ID: Mia Baker, female   DOB: [birth year], 24 y.o.   MRN: [id number]  General Surgery [facility]  Subjective: Patient "better" but still having significant pain early in the morning requiring IV narcotics for relief.  No nausea or emesis - eating regular breakfast this morning.  Objective: Vital signs in last 24 hours: Temp:  [98.2 F (36.8 C)-98.4 F (36.9 C)] 98.4 F (36.9 C) (10/16 0600) Pulse Rate:  [66-77] 66 (10/16 0600) Resp:  [14-16] 16 (10/16 0600) BP: (102-128)/(60-83) 114/64 mmHg (10/16 0600) SpO2:  [100 %] 100 % (10/16 0600)    Intake/Output from previous day: 10/15 0701 - 10/16 0700 In: 1200 [I.V.:1200] Out: 2250 [Urine:2250] Intake/Output this shift:    Physical Exam: HEENT - sclerae clear, mucous membranes moist Neck - soft Chest - clear bilaterally Cor - RRR Abdomen - soft, scaphoid; positive voluntary guarding with diffuse tenderness, poorly localized, max in RLQ causing tears; BS present Ext - no edema, non-tender Neuro - alert & oriented, no focal deficits  Lab Results:   Recent Labs  01/20/15 0500 01/21/15 0621  WBC 11.9* 9.5  HGB 12.4 11.5*  HCT 36.9 34.2*  PLT 235 282   BMET  Recent Labs  01/20/15 0500 01/21/15 0621  NA 138 139  K 4.7 4.8  CL 102 105  CO2 26 29  GLUCOSE 133* 97  BUN 5* 6  CREATININE 0.61 0.83  CALCIUM 9.5 9.0   PT/INR No results for input(s): LABPROT, INR in the last 72 hours. Comprehensive Metabolic Panel:    Component Value Date/Time   NA 139 01/21/2015 0621   NA 138 01/20/2015 0500   K 4.8 01/21/2015 0621   K 4.7 01/20/2015 0500   CL 105 01/21/2015 0621   CL 102 01/20/2015 0500   CO2 29 01/21/2015 0621   CO2 26 01/20/2015 0500   BUN 6 01/21/2015 0621   BUN 5* 01/20/2015 0500   CREATININE 0.83 01/21/2015 0621   CREATININE 0.61 01/20/2015 0500   GLUCOSE 97 01/21/2015 0621   GLUCOSE 133* 01/20/2015 0500   CALCIUM 9.0 01/21/2015 0621   CALCIUM 9.5 01/20/2015 0500   AST 17 01/18/2015 0900   AST 27 08/09/2014 0841   ALT 15 01/18/2015 0900   ALT 32 08/09/2014 0841   ALKPHOS 46 01/18/2015 0900   ALKPHOS 62 08/09/2014 0841   BILITOT 0.8 01/18/2015 0900   BILITOT 0.5 08/09/2014 0841   PROT 6.7 01/18/2015 0900   PROT 7.2 08/09/2014 0841   ALBUMIN 3.6 01/18/2015 0900   ALBUMIN 4.2 08/09/2014 0841    Studies/Results: No results found.  Anti-infectives: Anti-infectives    Start     Dose/Rate Route Frequency Ordered Stop   01/18/15 1800  piperacillin-tazobactam (ZOSYN) IVPB 3.375 g     3.375 g 12.5 mL/hr over 240 Minutes Intravenous Every 8 hours 01/18/15 1333     01/18/15 1330  piperacillin-tazobactam (ZOSYN) IVPB 3.375 g  Status:  Discontinued     3.375 g 12.5 mL/hr over 240 Minutes Intravenous 3 times per day 01/18/15 1323 01/18/15 1333   01/18/15 1015  piperacillin-tazobactam (ZOSYN) IVPB 3.375 g     3.375 g 100 mL/hr over 30 Minutes Intravenous  Once 01/18/15 1011 01/18/15 1113      Assessment & Plans: RLQ abdominal pain, leukocytosis, fever  WBC normal this AM Negative appendectomy, no other acute findings intra-abdominally IV Zosyn empirically Requiring IV narcotics for pain control  Appreciate medical service consultation and assistance  UTI - E.coli IV Zosyn -  sensitive  Resistant to ampicillin, augmentin, bactrim - Sensitive to Cipro - will leave choice of abx to medicine who will see later today   Patient wants to go home but still requiring IV narcotics for pain control and has ambulated only a few feet.  Likely will require another day in hospital and then home on oral abx if symptoms continue to improve.  I am trying to locate the operative photographs for the patient.  Velora Hecklerodd M. Imagene Boss, MD, [facility] Office: [phone]   Jojo Pehl Judie PetitM 01/21/2015

## 2015-01-21 NOTE — Progress Notes (Addendum)
Patient ID: Mia Baker, female   DOB: [birth year], 24 y.o.   MRN: [id number]  [facility] PROGRESS NOTE  Mia Baker UUV:[id number] DOB: 07-13-1990 DOA: 01/18/2015 PCP: Mia Climes, PA-C   Brief narrative:    Pt is 24 yo female with no specific past medical history, admitted 10/13 for evaluation of persistent RLQ pain. Initial imaging studies were notable for some pelvic fluid and potential acute appendicitis. Pt underwent appendectomy 10/14 with no post op complications but pain is still present and pt reports it is intermittent and 7/10 in severity when present, no specific aggravating factors but occasionally worse with movement. PT also reports that pain occasionally but not consistently radiates across the lower abd area to the left side. Pt reports pain is alleviated with oral and IV analgesia provided in the hospital. Pt denies fevers, chills, no similar events in the past. Pt also denies history of ectopic pregnancy or STD.  Assessment/Plan:    RLQ pain - now status post appendectomy post op day #2 - unclear why pt still having pain but she said it is getting better - will transition to oral Cipro based on urine culture and sensitivity report - encouraged ambulation as tolerating  - recommended outpatient follow up with her GYN in [place] if this pain persists   Debbora Presto, MD [facility] Pager [phone] Cell [phone]  Code Status: Full.  Family Communication:  plan of care discussed with the patient Disposition Plan: Home when stable.   IV access:  Peripheral IV  Procedures and diagnostic studies:    Ct Abdomen Pelvis W Contrast 01/18/2015 A small amount of fluid is seen in the region the appendix which appears slightly complex. This fluid may well be tracking from the pelvis to the appendix. This finding must be viewed, however, as somewhat equivocal for potential acute appendicitis. There is free fluid in the cul-de-sac.  Suspect recent ovarian cyst rupture as the most likely etiology. Probable mildly complex cyst right kidney. No bowel obstruction. No abscess.   Medical Consultants:  [facility]  IAnti-Infectives:   Zosyn, transition to Cipro 10/16 -->  Debbora Presto, MD  [facility] Pager [phone]  If 7PM-7AM, please contact night-coverage [email or web address] Password [email or web address] 01/21/2015, 12:43 PM   LOS: 1 day   HPI/Subjective: No events overnight.   Objective: Filed Vitals:   01/20/15 1149 01/20/15 1422 01/20/15 2200 01/21/15 0600  BP: 128/83 118/63 102/60 114/64  Pulse: 77 72 69 66  Temp: 98.4 F (36.9 C) 98.2 F (36.8 C) 98.3 F (36.8 C) 98.4 F (36.9 C)  TempSrc: Oral Oral Oral Oral  Resp: Height:      Weight:      SpO2: 100% 100% 100% 100%    Intake/Output Summary (Last 24 hours) at 01/21/15 1243 Last data filed at 01/21/15 1000  Gross per 24 hour  Intake   1200 ml  Output   1850 ml  Net   -650 ml    Exam:   General:  Pt is alert, follows commands appropriately, not in acute distress  Cardiovascular: Regular rate and rhythm, S1/S2, no murmurs, no rubs, no gallops  Respiratory: Clear to auscultation bilaterally, no wheezing, no crackles, no rhonchi  Abdomen: Soft, tender in RLQ, no guarding   Data Reviewed: Basic Metabolic Panel:  Recent Labs Lab 01/18/15 0900 01/19/15 0512 01/20/15 0500 01/21/15 0621  NElisha Ponder8 139  K 3.6 3.8 4.7 4.8  CL 104 106 102 105  CO2 26 24 26  29  GLUCOSE 103* 85 133* 97  BUN 5* <5* 5* 6  CREATININE 0.75 0.81 0.61 0.83  CALCIUM 9.0 8.8* 9.5 9.0   Liver Function Tests:  Recent Labs Lab 01/18/15 0900  AST 17  ALT 15  ALKPHOS 46  BILITOT 0.8  PROT 6.7  ALBUMIN 3.6    Recent Labs Lab 01/18/15 0900  LIPASE 20*   CBC:  Recent Labs Lab 01/18/15 0900 01/19/15 0512 01/20/15 0500 01/21/15 0621  WBC 12.6* 11.8* 11.9* 9.5  NEUTROABS 10.1*  --   --   --   HGB 12.6 11.4* 12.4 11.5*  HCT 37.0 34.5* 36.9 34.2*   MCV 94.6 96.1 93.9 95.3  PLT 214 206 235 282    Recent Results (from the past 240 hour(s))  Urine culture     Status: None   Collection Time: 01/18/15  8:15 AM  Result Value Ref Range Status   Specimen Description URINE, CLEAN CATCH  Final   Special Requests NONE  Final   Culture   Final    >=100,000 COLONIES/mL ESCHERICHIA COLI [facility]    Report Status 01/20/2015 FINAL  Final   Organism ID, Bacteria ESCHERICHIA COLI  Final      Susceptibility   Escherichia coli - MIC*    AMPICILLIN >=32 RESISTANT Resistant     CEFAZOLIN <=4 SENSITIVE Sensitive     CEFTRIAXONE <=1 SENSITIVE Sensitive     CIPROFLOXACIN <=0.25 SENSITIVE Sensitive     GENTAMICIN <=1 SENSITIVE Sensitive     IMIPENEM <=0.25 SENSITIVE Sensitive     NITROFURANTOIN 32 SENSITIVE Sensitive     TRIMETH/SULFA >=320 RESISTANT Resistant     AMPICILLIN/SULBACTAM >=32 RESISTANT Resistant     PIP/TAZO <=4 SENSITIVE Sensitive     * >=100,000 COLONIES/mL ESCHERICHIA COLI  Surgical pcr screen     Status: None   Collection Time: 01/19/15  8:37 AM  Result Value Ref Range Status   MRSA, PCR NEGATIVE NEGATIVE Final   Staphylococcus aureus NEGATIVE NEGATIVE Final    Comment:        The Xpert SA Assay (FDA approved for NASAL specimens in patients over 26 years of age), is one component of a comprehensive surveillance program.  Test performance has been validated by [facility] for patients greater than or equal to 26 year old. It is not intended to diagnose infection nor to guide or monitor treatment.      Scheduled Meds: . ciprofloxacin  500 mg Oral BID  . gabapentin  600 mg Oral QHS  . lamoTRIgine  100 mg Oral BID   Continuous Infusions: . dextrose 5 % and 0.45 % NaCl with KCl 20 mEq/L 50 mL/hr at 01/21/15 0159

## 2015-01-22 ENCOUNTER — Encounter (HOSPITAL_COMMUNITY): Payer: Self-pay | Admitting: Surgery

## 2015-01-22 ENCOUNTER — Encounter: Payer: Self-pay | Admitting: General Surgery

## 2015-01-22 LAB — CBC
HEMATOCRIT: 35 % — AB (ref 36.0–46.0)
HEMOGLOBIN: 11.6 g/dL — AB (ref 12.0–15.0)
MCH: 31.4 pg (ref 26.0–34.0)
MCHC: 33.1 g/dL (ref 30.0–36.0)
MCV: 94.9 fL (ref 78.0–100.0)
Platelets: 298 10*3/uL (ref 150–400)
RBC: 3.69 MIL/uL — ABNORMAL LOW (ref 3.87–5.11)
RDW: 12.5 % (ref 11.5–15.5)
WBC: 7.2 10*3/uL (ref 4.0–10.5)

## 2015-01-22 LAB — URINALYSIS, ROUTINE W REFLEX MICROSCOPIC
BILIRUBIN URINE: NEGATIVE
Glucose, UA: NEGATIVE mg/dL
HGB URINE DIPSTICK: NEGATIVE
Ketones, ur: NEGATIVE mg/dL
NITRITE: NEGATIVE
PROTEIN: NEGATIVE mg/dL
SPECIFIC GRAVITY, URINE: 1.006 (ref 1.005–1.030)
UROBILINOGEN UA: 1 mg/dL (ref 0.0–1.0)
pH: 7 (ref 5.0–8.0)

## 2015-01-22 LAB — URINE MICROSCOPIC-ADD ON

## 2015-01-22 MED ORDER — SENNOSIDES-DOCUSATE SODIUM 8.6-50 MG PO TABS
2.0000 | ORAL_TABLET | Freq: Once | ORAL | Status: AC
  Administered 2015-01-22: 2 via ORAL
  Filled 2015-01-22: qty 2

## 2015-01-22 MED ORDER — SACCHAROMYCES BOULARDII 250 MG PO CAPS
ORAL_CAPSULE | ORAL | Status: DC
Start: 2015-01-22 — End: 2015-08-22

## 2015-01-22 MED ORDER — IBUPROFEN 200 MG PO TABS: ORAL_TABLET | ORAL | Status: DC

## 2015-01-22 MED ORDER — HYDROCODONE-ACETAMINOPHEN 5-325 MG PO TABS: 1.0000 | ORAL_TABLET | ORAL | Status: DC | PRN

## 2015-01-22 MED ORDER — SACCHAROMYCES BOULARDII 250 MG PO CAPS
250.0000 mg | ORAL_CAPSULE | Freq: Two times a day (BID) | ORAL | Status: DC
  Filled 2015-01-22 (×2): qty 1

## 2015-01-22 MED ORDER — CIPROFLOXACIN HCL 500 MG PO TABS: 500.0000 mg | ORAL_TABLET | Freq: Two times a day (BID) | ORAL | Status: DC

## 2015-01-22 NOTE — Discharge Summary (Signed)
Physician Discharge Summary  Patient ID: Mia PonderKarmen Baker MRN: [id number] DOB/AGE: October 05, 1990 24 y.o.  Admit date: 01/18/2015 Discharge date: 01/22/2015  Admission Diagnoses:  RLQ abdominal pain, leukocytosis, fever Ruptured ovarian cyst vs appendicitis Possible UTI Chronic Migraines  Discharge Diagnoses:  RLQ abdominal pain, leukocytosis, fever Negative appendectomy, no other acute findings intra-abdominally E Coli UTI Uncontrolled ongoing complaints of pain. Negative GYN evaluation  Active Problems:   Appendicitis   Abdominal pain   PROCEDURES: S/p Diagnostic laparoscopy with laparoscopic appendectomy, 01/19/15, Dr. Laurin CoderGerkin   Hospital Course:  [facility] Am with abdominal pain starting in the periumbilical area and going to the RLQ. She has had some nausea but no vomiting. This pain is not like what she has with normal cycle.  Work up shows some fever up to 101.5 on admit. Labs are normal except for a WBC of 12.6. UA shows 11-20 WBC/HPF. Pregnancy is negative. CT scan shows: The appendix appears normal in size and contour. There is no surrounding mesenteric inflammation. A small amount of fluid is seen in the region the appendix which appears slightly complex. This fluid may well be tracking from the pelvis to the appendix. This finding must be viewed, however, as somewhat equivocal for potential acute appendicitis. No bowel obstruction or abscess. There is free fluid in the cul-de-sac. Suspect recent ovarian cyst rupture as the most likely etiology. We are ask to see and she is [facility].    [facility] and she was tender in the RLQ, but with CT finding Dr. Gerrit FriendsGerkin did not think she warranted surgery at that point. We placed her on Zosyn, and observed overnight. Urine culture was requested on admit also. That evening she spiked temps up to 100.9, WBC remained elevated and she had significant pain in the RLQ.   She was seen and taken to the OR later that day by Dr. Gerrit FriendsGerkin.  She underwent laparoscopy and nothing was found to explain pain or fever.  He did do the elective appendectomy.  Pathology has not come back at this point.  She continued to have significant pain post op.  We ask GYN (Dr. Scheryl DarterJames Arnold) to see and they did not find anything new to add to explain her ongoing pain. We also ask Medicine to see, and again no real issues were found.  Her urine culture did grow out E. Coli that has a wide range of drugs it was not sensitive to.  She had the antibiotic changed to Cipro yesterday.  Today she is still complaining of pain but it is better.  Repeat CBC and UA show a normal WBC but the UA shows ongoing WBC in her urine.  By the afternoon today she was feeling better, dressing were removed and she felt much better after a shower.  She is ready to go home at this point.  She will follow with Dr. Gerrit FriendsGerkin, her PCP and Dr. Allena KatzPatel her OB-Gyn.    CBC Latest Ref Rng 01/22/2015 01/21/2015 01/20/2015  WBC 4.0 - 10.5 K/uL 7.2 9.5 11.9(H)  Hemoglobin 12.0 - 15.0 g/dL 11.6(L) 11.5(L) 12.4  Hematocrit 36.0 - 46.0 % 35.0(L) 34.2(L) 36.9  Platelets 150 - 400 K/uL 298 282 235   CMP Latest Ref Rng 01/21/2015 01/20/2015 01/19/2015  Glucose 65 - 99 mg/dL 97 725(D133(H) 85  BUN 6 - 20 mg/dL 6 5(L) <6(U<5(L)  Creatinine 0.44 - 1.00 mg/dL 4.400.83 3.470.61 4.250.81  Sodium 135 - 145 mmol/L  139 138 137  Potassium 3.5 - 5.1 mmol/L 4.8 4.7 3.8  Chloride 101 - 111 mmol/L 105 102 106  CO2 22 - 32 mmol/L Calcium 8.9 - 10.3 mg/dL 9.0 9.5 4.0(J)  Total Protein 6.5 - 8.1 g/dL - - -  Total Bilirubin 0.3 - 1.2 mg/dL - - -  Alkaline Phos 38 - 126 U/L - - -  AST 15 - 41 U/L - - -  ALT 14 - 54 U/L - - -   Urine culture  E coli UTI, resistant to multiple drugs:  AMPICILLIN Resistant >=32 RESISTANT Final    Method: MIC    AMPICILLIN/SULBACTAM Resistant >=32 RESISTANT Final    Method: MIC    CEFAZOLIN  Sensitive <=4 SENSITIVE Final    Method: MIC    CEFTRIAXONE Sensitive <=1 SENSITIVE Final    Method: MIC    CIPROFLOXACIN Sensitive <=0.25 SENSITIVE Final    Method: MIC    GENTAMICIN Sensitive <=1 SENSITIVE Final    Method: MIC    IMIPENEM Sensitive <=0.25 SENSITIVE Final    Method: MIC    NITROFURANTOIN Sensitive 32 SENSITIVE Final    Method: MIC    PIP/TAZO Sensitive <=4 SENSITIVE Final    Method: MIC    TRIMETH/SULFA Resistant >=320 RESISTANT Final                   Disposition:  Home  Condition on d/c:  Improved    Medication List    STOP taking these medications        NYQUIL COLD & FLU PO      TAKE these medications        ciprofloxacin 500 MG tablet  Commonly known as:  CIPRO  Take 1 tablet (500 mg total) by mouth 2 (two) times daily.     gabapentin 600 MG tablet  Commonly known as:  NEURONTIN  Take 600 mg by mouth at bedtime.     HYDROcodone-acetaminophen 5-325 MG tablet  Commonly known as:  NORCO/VICODIN  Take 1-2 tablets by mouth every 4 (four) hours as needed for moderate pain.     ibuprofen 200 MG tablet  Commonly known as:  MOTRIN IB  You can take 2-3 tablets every 6 hours as needed for pain safely.     lamoTRIgine 100 MG tablet  Commonly known as:  LAMICTAL  Take 100 mg by mouth 2 (two) times daily.     norethindrone 0.35 MG tablet  Commonly known as:  MICRONOR,CAMILA,ERRIN  Take 1 tablet by mouth daily.           Follow-up Information    Follow up with Piedad Climes, PA-C.   Specialty:  Family Medicine   Why:  Call for a follow up appointment next week, to check urine.   Contact information:   [address] [place] [address] [phone]       Follow up with Reynold Bowen, MD. Schedule an appointment as soon as possible for a visit in 2 weeks.   Specialty:  Obstetrics and Gynecology   Why:   Call for follow up appointment in 1-2 weeks.   Contact information:   [address] [place] [address]       Follow up with Velora Heckler, MD. Schedule an appointment as soon as possible for a visit in 2 weeks.   Specialty:  General Surgery   Why:  Call for issues as needed.   Contact information:  [address] [place] [address] [phone]       Signed: Sherrie George 01/22/2015, 12:48 PM  Agree with above.  Ovidio Kin, MD, [facility] Pager: [phone] Office phone:  [phone]

## 2015-01-22 NOTE — Progress Notes (Signed)
Patient is ambulating in hall without diff., tolerating regular diet, pain controlled with oral meds..  Discharge instructions reviewed with patient, prescriptions given to patient.  Patient verbalizes understanding.  Patient discharged to home.

## 2015-01-22 NOTE — Progress Notes (Signed)
3 Days Post-Op  Subjective: She still complains of severe pain, when I took dressings off and told her she could shower she said she couldn't stand, walking with walker.  She denies any other anxiety issues aside from abdominal pain.    Objective: Vital signs in last 24 hours: Temp:  [97.9 F (36.6 C)-98.4 F (36.9 C)] 97.9 F (36.6 C) (10/17 41320633) Pulse Rate:  [75-80] 75 (10/16 2153) Resp:  [14-17] 17 (10/17 0633) BP: (105-127)/(62-81) 110/62 mmHg (10/17 0633) SpO2:  [97 %-100 %] 99 % (10/17 0633) Last BM Date: 01/17/15 800 PO 3200 urine Afebrile, VSS Labs are all normal yesterday E coli UTI, resistant to multiple drugs:  AMPICILLIN Resistant >=32 RESISTANT Final    Method: MIC    AMPICILLIN/SULBACTAM Resistant >=32 RESISTANT Final    Method: MIC    CEFAZOLIN Sensitive <=4 SENSITIVE Final    Method: MIC    CEFTRIAXONE Sensitive <=1 SENSITIVE Final    Method: MIC    CIPROFLOXACIN Sensitive <=0.25 SENSITIVE Final    Method: MIC    GENTAMICIN Sensitive <=1 SENSITIVE Final    Method: MIC    IMIPENEM Sensitive <=0.25 SENSITIVE Final    Method: MIC    NITROFURANTOIN Sensitive 32 SENSITIVE Final    Method: MIC    PIP/TAZO Sensitive <=4 SENSITIVE Final    Method: MIC    TRIMETH/SULFA Resistant >=320 RESISTANT Final           Intake/Output from previous day: 10/16 0701 - 10/17 0700 In: 800 [I.V.:800] Out: 3200 [Urine:3200] Intake/Output this shift:    General appearance: alert, cooperative and no distress Resp: clear to auscultation bilaterally GI: soft, + BS, no distension, still complaining of pain Right side, mostly in the RLQ.  Lab Results:   Recent Labs  01/20/15 0500 01/21/15 0621  WBC 11.9* 9.5  HGB 12.4 11.5*  HCT 36.9 34.2*  PLT 235 282    BMET  Recent Labs  01/20/15 0500 01/21/15 0621  NA 138 139  K 4.7 4.8  CL 102 105  CO2 26 29  GLUCOSE 133* 97  BUN 5* 6  CREATININE 0.61 0.83  CALCIUM 9.5 9.0    PT/INR No results for input(s): LABPROT, INR in the last 72 hours.   Recent Labs Lab 01/18/15 0900  AST 17  ALT 15  ALKPHOS 46  BILITOT 0.8  PROT 6.7  ALBUMIN 3.6     Lipase     Component Value Date/Time   LIPASE 20* 01/18/2015 0900     Studies/Results: No results found.  Medications: . ciprofloxacin  500 mg Oral BID  . gabapentin  600 mg Oral QHS  . lamoTRIgine  100 mg Oral BID    Assessment/Plan RLQ abdominal pain, leukocytosis, fever Negative appendectomy, no other acute findings intra-abdominally S/p Diagnostic laparoscopy with laparoscopic appendectomy, 01/19/15, Dr. Eduard ClosGerkin  E  Coli UTI Uncontrolled ongoing complaints of pain. Negative GYN evaluation Antibiotics:  Day 4 Zosyn completed 10/16, Day 2 of Cipro today DVT: SCD  Plan:  Repeat UA/CBC.  I cannot explain her pain.  She is on oral pain meds, she has not have Dilaudid since yesterday AM.  Describes pain as a 5-7/10.      LOS: 2 days    JENNINGS,WILLARD 01/22/2015  Agree with above. She is ready to go home. There is no surgical reason to keep her in the hospital.  Home today.  Ovidio Kinavid Nabiha Planck, MD, [facility] Pager: [phone] Office phone:  [phone]

## 2015-01-22 NOTE — Discharge Instructions (Signed)
Laparoscopic Cholecystectomy, Care After °Refer to this sheet in the next few weeks. These instructions provide you with information about caring for yourself after your procedure. Your health care provider may also give you more specific instructions. Your treatment has been planned according to current medical practices, but problems sometimes occur. Call your health care provider if you have any problems or questions after your procedure. °WHAT TO EXPECT AFTER THE PROCEDURE °After your procedure, it is common to have: °· Pain at your incision sites. You will be given pain medicines to control your pain. °· Mild nausea or vomiting. This should improve after the first 24 hours. °· Bloating and possible shoulder pain from the gas that was used during the procedure. This will improve after the first 24 hours. °HOME CARE INSTRUCTIONS °Incision Care °· Follow instructions from your health care provider about how to take care of your incisions. Make sure you: °¨ Wash your hands with soap and water before you change your bandage (dressing). If soap and water are not available, use hand sanitizer. °¨ Change your dressing as told by your health care provider. °¨ Leave stitches (sutures), skin glue, or adhesive strips in place. These skin closures may need to be in place for 2 weeks or longer. If adhesive strip edges start to loosen and curl up, you may trim the loose edges. Do not remove adhesive strips completely unless your health care provider tells you to do that. °· Do not take baths, swim, or use a hot tub until your health care provider approves. Ask your health care provider if you can take showers. You may only be allowed to take sponge baths for bathing. °General Instructions °· Take over-the-counter and prescription medicines only as told by your health care provider. °· Do not drive or operate heavy machinery while taking prescription pain medicine. °· Return to your normal diet as told by your health care  provider. °· Do not lift anything that is heavier than 10 lb (4.5 kg). °· Do not play contact sports for one week or until your health care provider approves. °SEEK MEDICAL CARE IF:  °· You have redness, swelling, or pain at the site of your incision. °· You have fluid, blood, or pus coming from your incision. °· You notice a bad smell coming from your incision area. °· Your surgical incisions break open. °· You have a fever. °SEEK IMMEDIATE MEDICAL CARE IF: °· You develop a rash. °· You have difficulty breathing. °· You have chest pain. °· You have increasing pain in your shoulders (shoulder strap areas). °· You faint or have dizzy episodes while you are standing. °· You have severe pain in your abdomen. °· You have nausea or vomiting that lasts for more than one day. °  °This information is not intended to replace advice given to you by your health care provider. Make sure you discuss any questions you have with your health care provider. °  °Document Released: 03/24/2005 Document Revised: 12/13/2014 Document Reviewed: 11/03/2012 °Elsevier Interactive Patient Education ©2016 Elsevier Inc. °CCS ______[facility] °LAPAROSCOPIC SURGERY: POST OP INSTRUCTIONS °Always review your discharge instruction sheet given to you by the facility where your surgery was performed. °IF YOU HAVE DISABILITY OR FAMILY LEAVE FORMS, YOU MUST BRING THEM TO THE OFFICE FOR PROCESSING.   °DO NOT GIVE THEM TO YOUR DOCTOR. ° °1. A prescription for pain medication may be given to you upon discharge.  Take your pain medication as prescribed, if needed.  If narcotic   pain medicine is not needed, then you may take acetaminophen (Tylenol) or ibuprofen (Advil) as needed. °2. Take your usually prescribed medications unless otherwise directed. °3. If you need a refill on your pain medication, please contact your pharmacy.  They will contact our office to request authorization. Prescriptions will not be filled after 5pm or on  week-ends. °4. You should follow a light diet the first few days after arrival home, such as soup and crackers, etc.  Be sure to include lots of fluids daily. °5. Most patients will experience some swelling and bruising in the area of the incisions.  Ice packs will help.  Swelling and bruising can take several days to resolve.  °6. It is common to experience some constipation if taking pain medication after surgery.  Increasing fluid intake and taking a stool softener (such as Colace) will usually help or prevent this problem from occurring.  A mild laxative (Milk of Magnesia or Miralax) should be taken according to package instructions if there are no bowel movements after 48 hours. °7. Unless discharge instructions indicate otherwise, you may remove your bandages 24-48 hours after surgery, and you may shower at that time.  You may have steri-strips (small skin tapes) in place directly over the incision.  These strips should be left on the skin for 7-10 days.  If your surgeon used skin glue on the incision, you may shower in 24 hours.  The glue will flake off over the next 2-3 weeks.  Any sutures or staples will be removed at the office during your follow-up visit. °8. ACTIVITIES:  You may resume regular (light) daily activities beginning the next day--such as daily self-care, walking, climbing stairs--gradually increasing activities as tolerated.  You may have sexual intercourse when it is comfortable.  Refrain from any heavy lifting or straining until approved by your doctor. °a. You may drive when you are no longer taking prescription pain medication, you can comfortably wear a seatbelt, and you can safely maneuver your car and apply brakes. °b. RETURN TO WORK:  __________________________________________________________ °9. You should see your doctor in the office for a follow-up appointment approximately 2-3 weeks after your surgery.  Make sure that you call for this appointment within a day or two after you  arrive home to insure a convenient appointment time. °10. OTHER INSTRUCTIONS: __________________________________________________________________________________________________________________________ __________________________________________________________________________________________________________________________ °WHEN TO CALL YOUR DOCTOR: °1. Fever over 101.0 °2. Inability to urinate °3. Continued bleeding from incision. °4. Increased pain, redness, or drainage from the incision. °5. Increasing abdominal pain ° °The clinic staff is available to answer your questions during regular business hours.  Please don’t hesitate to call and ask to speak to one of the nurses for clinical concerns.  If you have a medical emergency, go to the nearest emergency room or call 911.  A surgeon from [facility] is always on call at the hospital. °[address], [address], [place], [place]  [address] ? [address] [address], [place], [place]   [address] °[phone] ? [phone] ? FAX [phone] °Web site: [email or web address] ° °

## 2015-01-22 NOTE — Progress Notes (Signed)
Patient ID: Mia Baker, female   DOB: 04-13-1990, 24 y.o.   MRN: [id number]  [facility] PROGRESS NOTE  Mia Baker AOZ:[id number] DOB: 04-13-1990 DOA: 01/18/2015 PCP: Piedad ClimesMartin, William Cody, PA-C   Brief narrative:    Pt is 24 yo female with no specific past medical history, admitted 10/13 for evaluation of persistent RLQ pain. Initial imaging studies were notable for some pelvic fluid and potential acute appendicitis. Pt underwent appendectomy 10/14 with no post op complications but pain is still present and pt reports it is intermittent and 7/10 in severity when present, no specific aggravating factors but occasionally worse with movement. PT also reports that pain occasionally but not consistently radiates across the lower abd area to the left side. Pt reports pain is alleviated with oral and IV analgesia provided in the hospital. Pt denies fevers, chills, no similar events in the past. Pt also denies history of ectopic pregnancy or STD.  Assessment/Plan:    RLQ pain - now status post appendectomy post op day #3 - unclear why pt still having pain but she said it is getting better - agree with repeating UA, continue Cipro as first cultures with E. Coli indicate sensitivity  - encouraged ambulation as tolerating  - recommended outpatient follow up with her GYN in [place] if this pain persists   Will sign off for now, call us if needed. Thank you   Debbora PrestoMAGICK-Yuktha Kerchner, MD [facility] Pager [phone] Cell [phone]  Disposition Plan: Home per primary

## 2015-01-23 ENCOUNTER — Telehealth: Payer: Self-pay

## 2015-01-23 NOTE — Telephone Encounter (Signed)
Admit date: 01/18/2015 Discharge date: 01/22/2015  Reason for admission:  RLQ abdominal pain, leukocytosis, fever Ruptured ovarian cyst vs appendicitis Possible UTI Chronic Migraines  Called patient. She says that she plans to follow up with her OB-Gyn and Careers adviserurgeon.  She says she would get Ob-Gyn to check urine.  Pt will call and schedule an appointment with Malva Coganody Martin, PA-C as she sees necessary.  Message routed to Saks IncorporatedCody Martin, VF CorporationPA-C for [employer].

## 2015-01-23 NOTE — Telephone Encounter (Signed)
Noted. Agree.

## 2015-02-01 ENCOUNTER — Encounter: Payer: Self-pay | Admitting: Surgery

## 2015-02-01 DIAGNOSIS — K3580 Unspecified acute appendicitis: Secondary | ICD-10-CM | POA: Insufficient documentation

## 2015-08-22 ENCOUNTER — Encounter: Payer: Self-pay | Admitting: Physician Assistant

## 2015-08-22 ENCOUNTER — Telehealth: Payer: Self-pay | Admitting: Physician Assistant

## 2015-08-22 ENCOUNTER — Ambulatory Visit (INDEPENDENT_AMBULATORY_CARE_PROVIDER_SITE_OTHER): Payer: 59 | Admitting: Physician Assistant

## 2015-08-22 VITALS — BP 100/78 | HR 58 | Temp 98.1°F | Resp 16 | Ht 67.0 in | Wt 136.0 lb

## 2015-08-22 DIAGNOSIS — L255 Unspecified contact dermatitis due to plants, except food: Secondary | ICD-10-CM | POA: Diagnosis not present

## 2015-08-22 DIAGNOSIS — B360 Pityriasis versicolor: Secondary | ICD-10-CM | POA: Diagnosis not present

## 2015-08-22 MED ORDER — ITRACONAZOLE 100 MG PO CAPS: 100.0000 mg | ORAL_CAPSULE | Freq: Every day | ORAL | Status: DC

## 2015-08-22 MED ORDER — METHYLPREDNISOLONE 4 MG PO TBPK: ORAL_TABLET | ORAL | Status: DC

## 2015-08-22 NOTE — Telephone Encounter (Signed)
[facility] [place] [place] - [address] [phone] (Phone) [phone] (Fax)         Reason for call: Needs to clarify rx sent in today for itraconazole (SPORANOX) 100 MG capsule [[id number]][[id number]] there is a drug interaction with ALPRAZolam (XANAX) 1 MG tablet

## 2015-08-22 NOTE — Progress Notes (Signed)
Patient presents to clinic today c/o pruritic rash of bilateral upper and lower extremities since Monday, worsening since onset. Rash first appeared after doing some yard work and pulling weeds. Has history of poison ivy rash. Denies rash of torso, groin, face or neck. Denies fever, chills. Denies change to soaps, lotions, hygiene products or detergent. Denies sick contact.  Patient endorses a recurrent "rash" of chest and back that she was told was yeast. Has been treated previously with resolution of rash. Endorses the rash is white spots on her skin that get worse after she has tanned. Denies pain or itch.  Past Medical History  Diagnosis Date  . Migraines   . Allergy     Current Outpatient Prescriptions on File Prior to Visit  Medication Sig Dispense Refill  . gabapentin (NEURONTIN) 600 MG tablet Take 1,800 mg by mouth at bedtime.     Marland Kitchen ibuprofen (MOTRIN IB) 200 MG tablet You can take 2-3 tablets every 6 hours as needed for pain safely. 30 tablet 0   No current facility-administered medications on file prior to visit.    Allergies  Allergen Reactions  . Penicillins Other (See Comments)    Tolerated Zosyn Has patient had a PCN reaction causing immediate rash, facial/tongue/throat swelling, SOB or lightheadedness with hypotension: No Has patient had a PCN reaction causing severe rash involving mucus membranes or skin necrosis: No Has patient had a PCN reaction that required hospitalization No Has patient had a PCN reaction occurring within the last 10 years: No If all of the above answers are "NO", then may proceed with Cephalosporin use.Unsure of reaction, was as a child    Family History  Problem Relation Age of Onset  . Anemia Mother   . Hypertension Father   . Cancer Maternal Grandmother     unsure of type    Social History   Social History  . Marital Status: Single    Spouse Name: N/A  . Number of Children: N/A  . Years of Education: N/A   Social History Main  Topics  . Smoking status: Former Games developer  . Smokeless tobacco: Never Used  . Alcohol Use: 3.0 oz/week    5 Standard drinks or equivalent per week  . Drug Use: Yes     Comment: marijuana - rare  . Sexual Activity:    Partners: Male   Other Topics Concern  . None   Social History Narrative   Airline pilot (advertising)   Engaged; No children   Review of Systems - See HPI.  All other ROS are negative.  BP 100/78 mmHg  Pulse 58  Temp(Src) 98.1 F (36.7 C) (Oral)  Resp 16  Ht  (1.702 m)  Wt 136 lb (61.689 kg)  BMI 21.30 kg/m2  SpO2 98%  LMP 08/08/2015  Physical Exam  Constitutional: She is oriented to person, place, and time and well-developed, well-nourished, and in no distress.  HENT:  Head: Normocephalic and atraumatic.  Cardiovascular: Normal rate, regular rhythm, normal heart sounds and intact distal pulses.   Pulmonary/Chest: Effort normal and breath sounds normal. No respiratory distress. She has no wheezes. She has no rales. She exhibits no tenderness.  Neurological: She is alert and oriented to person, place, and time.  Skin: Skin is warm and dry.     Vitals reviewed.  Assessment/Plan: 1. Rhus dermatitis Rx Medrol dose pack. Witch hazel recommended to dry up areas. Sarna lotion or OTC antihistamine for itch as the steroid will also help with this. Supportive  measures reviewed. Follow-up if not resolving.  - methylPREDNISolone (MEDROL DOSEPAK) 4 MG TBPK tablet; Take following package directions  Dispense: 21 tablet; Refill: 0  2. Tinea versicolor Rash most consistent with tinea versicolor. Will begin Itraconazole x 5 days. She is to avoid use of Xanax on those days but does not feel it will be a problem as she takes very rarely. FU PRN if symptoms are not resolving.  - itraconazole (SPORANOX) 100 MG capsule; Take 1 capsule (100 mg total) by mouth daily.  Dispense: 5 capsule; Refill: 0  Piedad ClimesMartin, Latanza Pfefferkorn Cody, [place] JerseyPA-C

## 2015-08-22 NOTE — Telephone Encounter (Signed)
She is only taking Xanax rarely and was told she would be unable to take the Xanax until she completed her Itraconazole therapy. She agreed to this. Please inform pharmacy.

## 2015-08-22 NOTE — Patient Instructions (Signed)
Please take the steroid pack as directed. Apply Sarna lotion to the area for itch. Witch hazel will help lesions dry up. Wear long-sleeves and gloves when planting to avoid further exposure. Let me know if symptoms are not resolving.  For the tinea versicolor rash, take the Itraconazole as directed.  If symptoms are not resolving, please let me know.

## 2015-08-22 NOTE — Progress Notes (Signed)
Pre visit review using our clinic review tool, if applicable. No additional management support is needed unless otherwise documented below in the visit note/SLS  

## 2015-08-23 NOTE — Telephone Encounter (Signed)
Informed pharmacy of the below. No further questions or concerns.

## 2016-04-10 ENCOUNTER — Ambulatory Visit (INDEPENDENT_AMBULATORY_CARE_PROVIDER_SITE_OTHER): Payer: 59

## 2016-04-10 ENCOUNTER — Ambulatory Visit (INDEPENDENT_AMBULATORY_CARE_PROVIDER_SITE_OTHER): Payer: 59 | Admitting: Urgent Care

## 2016-04-10 VITALS — BP 110/70 | HR 87 | Temp 98.1°F | Resp 18 | Ht 67.0 in | Wt 128.4 lb

## 2016-04-10 DIAGNOSIS — R21 Rash and other nonspecific skin eruption: Secondary | ICD-10-CM | POA: Diagnosis not present

## 2016-04-10 DIAGNOSIS — R05 Cough: Secondary | ICD-10-CM | POA: Diagnosis not present

## 2016-04-10 DIAGNOSIS — Z87891 Personal history of nicotine dependence: Secondary | ICD-10-CM | POA: Diagnosis not present

## 2016-04-10 DIAGNOSIS — F1290 Cannabis use, unspecified, uncomplicated: Secondary | ICD-10-CM

## 2016-04-10 DIAGNOSIS — R059 Cough, unspecified: Secondary | ICD-10-CM

## 2016-04-10 LAB — POCT URINE PREGNANCY: Preg Test, Ur: NEGATIVE

## 2016-04-10 LAB — POCT SKIN KOH: SKIN KOH, POC: NEGATIVE

## 2016-04-10 MED ORDER — KETOCONAZOLE 2 % EX CREA: 1.0000 "application " | TOPICAL_CREAM | Freq: Every day | CUTANEOUS | 0 refills | Status: DC

## 2016-04-10 MED ORDER — HYDROCODONE-HOMATROPINE 5-1.5 MG/5ML PO SYRP: 5.0000 mL | ORAL_SOLUTION | Freq: Every evening | ORAL | 0 refills | Status: DC | PRN

## 2016-04-10 MED ORDER — FLUCONAZOLE 200 MG PO TABS: 400.0000 mg | ORAL_TABLET | Freq: Once | ORAL | 0 refills | Status: AC

## 2016-04-10 MED ORDER — AZITHROMYCIN 250 MG PO TABS: ORAL_TABLET | ORAL | 0 refills | Status: DC

## 2016-04-10 MED ORDER — BENZONATATE 100 MG PO CAPS: 100.0000 mg | ORAL_CAPSULE | Freq: Three times a day (TID) | ORAL | 0 refills | Status: DC | PRN

## 2016-04-10 NOTE — Patient Instructions (Addendum)
Acute Bronchitis, Adult Acute bronchitis is sudden (acute) swelling of the air tubes (bronchi) in the lungs. Acute bronchitis causes these tubes to fill with mucus, which can make it hard to breathe. It can also cause coughing or wheezing. In adults, acute bronchitis usually goes away within 2 weeks. A cough caused by bronchitis may last up to 3 weeks. Smoking, allergies, and asthma can make the condition worse. Repeated episodes of bronchitis may cause further lung problems, such as chronic obstructive pulmonary disease (COPD). What are the causes? This condition can be caused by germs and by substances that irritate the lungs, including:  Cold and flu viruses. This condition is most often caused by the same virus that causes a cold.  Bacteria.  Exposure to tobacco smoke, dust, fumes, and air pollution. What increases the risk? This condition is more likely to develop in people who:  Have close contact with someone with acute bronchitis.  Are exposed to lung irritants, such as tobacco smoke, dust, fumes, and vapors.  Have a weak immune system.  Have a respiratory condition such as asthma. What are the signs or symptoms? Symptoms of this condition include:  A cough.  Coughing up clear, yellow, or green mucus.  Wheezing.  Chest congestion.  Shortness of breath.  A fever.  Body aches.  Chills.  A sore throat. How is this diagnosed? This condition is usually diagnosed with a physical exam. During the exam, your health care provider may order tests, such as chest X-rays, to rule out other conditions. He or she may also:  Test a sample of your mucus for bacterial infection.  Check the level of oxygen in your blood. This is done to check for pneumonia.  Do a chest X-ray or lung function testing to rule out pneumonia and other conditions.  Perform blood tests. Your health care provider will also ask about your symptoms and medical history. How is this treated? Most cases  of acute bronchitis clear up over time without treatment. Your health care provider may recommend:  Drinking more fluids. Drinking more makes your mucus thinner, which may make it easier to breathe.  Taking a medicine for a fever or cough.  Taking an antibiotic medicine.  Using an inhaler to help improve shortness of breath and to control a cough.  Using a cool mist vaporizer or humidifier to make it easier to breathe. Follow these instructions at home: Medicines  Take over-the-counter and prescription medicines only as told by your health care provider.  If you were prescribed an antibiotic, take it as told by your health care provider. Do not stop taking the antibiotic even if you start to feel better. General instructions  Get plenty of rest.  Drink enough fluids to keep your urine clear or pale yellow.  Avoid smoking and secondhand smoke. Exposure to cigarette smoke or irritating chemicals will make bronchitis worse. If you smoke and you need help quitting, ask your health care provider. Quitting smoking will help your lungs heal faster.  Use an inhaler, cool mist vaporizer, or humidifier as told by your health care provider.  Keep all follow-up visits as told by your health care provider. This is important. How is this prevented? To lower your risk of getting this condition again:  Wash your hands often with soap and water. If soap and water are not available, use hand sanitizer.  Avoid contact with people who have cold symptoms.  Try not to touch your hands to your mouth, nose, or eyes.    Make sure to get the flu shot every year. Contact a health care provider if:  Your symptoms do not improve in 2 weeks of treatment. Get help right away if:  You cough up blood.  You have chest pain.  You have severe shortness of breath.  You become dehydrated.  You faint or keep feeling like you are going to faint.  You keep vomiting.  You have a severe headache.  Your  fever or chills gets worse. This information is not intended to replace advice given to you by your health care provider. Make sure you discuss any questions you have with your health care provider. Document Released: 05/01/2004 Document Revised: 10/17/2015 Document Reviewed: 09/12/2015 Elsevier Interactive Patient Education  2017 [employer]   Tinea Versicolor Tinea versicolor is a common fungal infection of the skin. It causes a rash that appears as light or dark patches on the skin. The rash most often occurs on the chest, back, neck, or upper arms. This condition is more common during warm weather. Other than affecting how your skin looks, tinea versicolor usually does not cause other problems. In most cases, the infection goes away in a few weeks with treatment. It may take a few months for the patches on your skin to clear up. What are the causes? Tinea versicolor occurs when a type of fungus that is normally present on the skin starts to overgrow. This fungus is a kind of yeast. The exact cause of the overgrowth is not known. This condition cannot be passed from one person to another (noncontagious). What increases the risk? This condition is more likely to develop when certain factors are present, such as:  Heat and humidity.  Sweating too much.  Hormone changes.  Oily skin.  A weak defense (immune) system. What are the signs or symptoms? Symptoms of this condition may include:  A rash on your skin that is made up of light or dark patches. The rash may have:  Patches of tan or pink spots on light skin.  Patches of white or brown spots on dark skin.  Patches of skin that do not tan.  Well-marked edges.  Scales on the discolored areas.  Mild itching. How is this diagnosed? A health care provider can usually diagnose this condition by looking at your skin. During the exam, he or she may use ultraviolet light to help determine the extent of the infection. In some cases,  a skin sample may be taken by scraping the rash. This sample will be viewed under a microscope to check for yeast overgrowth. How is this treated? Treatment for this condition may include:  Dandruff shampoo that is applied to the affected skin during showers or bathing.  Over-the-counter medicated skin cream, lotion, or soaps.  Prescription antifungal medicine in the form of skin cream or pills.  Medicine to help reduce itching. Follow these instructions at home:  Take medicines only as directed by your health care provider.  Apply dandruff shampoo to the affected area if told to do so by your health care provider. You may be instructed to scrub the affected skin for several minutes each day.  Do not scratch the affected area of skin.  Avoid hot and humid conditions.  Do not use tanning booths.  Try to avoid sweating a lot. Contact a health care provider if:  Your symptoms get worse.  You have a fever.  You have redness, swelling, or pain at the site of your rash.  You have fluid,  blood, or pus coming from your rash.  Your rash returns after treatment. This information is not intended to replace advice given to you by your health care provider. Make sure you discuss any questions you have with your health care provider. Document Released: 03/21/2000 Document Revised: 11/25/2015 Document Reviewed: 01/03/2014 Elsevier Interactive Patient Education  2017 [employer].     IF you received an x-ray today, you will receive an invoice from [facility]. Please contact [facility] at [phone] with questions or concerns regarding your invoice.   IF you received labwork today, you will receive an invoice from [facility]. Please contact [facility] at [phone] with questions or concerns regarding your invoice.   Our billing staff will not be able to assist you with questions regarding bills from these companies.  You will be contacted with the lab results as  soon as they are available. The fastest way to get your results is to activate your My Chart account. Instructions are located on the last page of this paperwork. If you have not heard from [place] regarding the results in 2 weeks, please contact this office.

## 2016-04-10 NOTE — Progress Notes (Signed)
MRN: [id number] DOB: November 30, 1990  Subjective:   Mia Baker is a 26 y.o. female presenting for chief complaint of Cough; Fatigue; Rash (Located on chest); and Flu Vaccine  Cough - Reports 1 week history of clear productive cough. Cough is worse at night, feels like a constant tickle in her throat, sore throat. Has been getting coughing fits, elicits shob and chest pain. Has have sweating episodes, subjective fever, decreased appetite. Has had 2 month history of intermittent bilateral ear pain, stuffy nose and sinus pain in the past 2 days. Reports several month history of rash over her chest. She was previously seen at [facility] and prescribed itraconazole, methylprednisolone without resolution of her rash. It is now spreading, characterized as a redness with some white patches, not itching or drainage, not painful. Denies eye pain, eye redness, ear drainage, vomiting, abdominal pain. Has a history of migraines, takes Lexapro, gabapentin, ibuprofen and Xanax for management of her migraines and anxiety. Denies smoking cigarettes. Smoke marijuana daily. Has ~2 drinks/day 5 days out of the week.   Mia Baker has a current medication list which includes the following prescription(s): alprazolam, escitalopram, gabapentin, ibuprofen, and norgestimate-ethinyl estradiol. Also is allergic to penicillins.  Mia Baker  has a past medical history of Allergy and Migraines. Also  has a past surgical history that includes Wisdom tooth extraction and laparoscopic appendectomy (N/A, 01/19/2015). Her family history includes Anemia in her mother; Cancer in her maternal grandmother; Hypertension in her father.   Objective:   Vitals: BP 110/70   Pulse 87   Temp 98.1 F (36.7 C) (Oral)   Resp 18   Ht 5\' 7"  (1.702 m)   Wt 128 lb 6.4 oz (58.2 kg)   LMP 03/09/2016   SpO2 98%   BMI 20.11 kg/m   Wt Readings from Last 3 Encounters:  04/10/16 128 lb 6.4 oz (58.2 kg)  08/22/15 136 lb (61.7 kg)  01/18/15 134 lb 8  oz (61 kg)    Physical Exam  Constitutional: She is oriented to person, place, and time. She appears well-developed and well-nourished.  HENT:  Left TM cerumen occluded, right TM is intact and without effusions or erythema. Nasal turbinates pink and moist, nasal passages patent. No sinus tenderness. Oropharynx with moderate post-nasal drainage, mucous membranes moist, dentition in good repair.  Eyes: Right eye exhibits no discharge. Left eye exhibits no discharge. No scleral icterus.  Neck: Normal range of motion. Neck supple.  Cardiovascular: Normal rate, regular rhythm and intact distal pulses.  Exam reveals no gallop and no friction rub.   No murmur heard. Pulmonary/Chest: No respiratory distress. She has no wheezes. She has no rales.  Rhonchi bilaterally, R>L.  Abdominal: Soft. Bowel sounds are normal. She exhibits no distension and no mass. There is no tenderness. There is no guarding.  Lymphadenopathy:    She has no cervical adenopathy.  Neurological: She is alert and oriented to person, place, and time.  Skin: Skin is warm and dry. Rash (3 clusters of hypopigmented patches of varying sizes) noted.   Examination of rash under Wood's lamp reveals a golden colored appearance.  Dg Chest 2 View  Result Date: 04/10/2016 CLINICAL DATA:  One week of cough worse at night constant throat irritation. Former smoker. EXAM: CHEST  2 VIEW COMPARISON:  None in PACs FINDINGS: The lungs are mildly hyperinflated. There is no focal infiltrate and no pleural effusion. No pulmonary parenchymal nodules or masses are observed. The heart and pulmonary vascularity are normal. The mediastinum is normal  in width. The bony thorax is unremarkable. IMPRESSION: Mild hyperinflation which may be voluntary or may reflect acute bronchitis or reactive airway disease. There is no pneumonia nor other acute cardiopulmonary abnormality. Electronically Signed   By: David  SwazilandJordan M.D.   On: 04/10/2016 14:44   Results for  orders placed or performed in visit on 04/10/16 (from the past 24 hour(s))  POCT Skin KOH     Status: None   Collection Time: 04/10/16  2:34 PM  Result Value Ref Range   Skin KOH, POC Negative   POCT urine pregnancy     Status: None   Collection Time: 04/10/16  2:40 PM  Result Value Ref Range   Preg Test, Ur Negative Negative   Assessment and Plan :   1. Rash and nonspecific skin eruption - Labs pending. I suspect patient has tinea versicolor. I believe she may have failed treatment with itraconazole due to concurrent use of methylprednisolone. Patient agreed to try diflucan and topical ketoconazole. She will rtc in 1 week if no improvement, consider biopsy, referral to dermatology.  2. Cough 3. Marijuana use 4. History of smoking - Will cover for acute bronchitis given radiology report. Start azithromycin, use cough suppression. Discussed need to stop smoking marijuana. Restart allergy medications, consider albuterol inhaler or Singulair. Patient agreed.  Mia BambergMario Orvie Caradine, PA-C [facility] [phone] 04/10/2016 2:01 PM

## 2016-04-11 LAB — CBC
HEMATOCRIT: 43.4 % (ref 34.0–46.6)
HEMOGLOBIN: 14.7 g/dL (ref 11.1–15.9)
MCH: 32 pg (ref 26.6–33.0)
MCHC: 33.9 g/dL (ref 31.5–35.7)
MCV: 95 fL (ref 79–97)
Platelets: 353 10*3/uL (ref 150–379)
RBC: 4.59 x10E6/uL (ref 3.77–5.28)
RDW: 13.1 % (ref 12.3–15.4)
WBC: 14.4 10*3/uL — ABNORMAL HIGH (ref 3.4–10.8)

## 2016-04-11 LAB — HIV ANTIBODY (ROUTINE TESTING W REFLEX): HIV Screen 4th Generation wRfx: NONREACTIVE

## 2016-04-16 ENCOUNTER — Emergency Department (HOSPITAL_COMMUNITY): Payer: 59

## 2016-04-16 ENCOUNTER — Emergency Department (HOSPITAL_COMMUNITY)
Admission: EM | Admit: 2016-04-16 | Discharge: 2016-04-16 | Disposition: A | Discharge status: Home / Self Care | Payer: 59 | Attending: Emergency Medicine | Admitting: Emergency Medicine

## 2016-04-16 ENCOUNTER — Encounter (HOSPITAL_COMMUNITY): Payer: Self-pay | Admitting: *Deleted

## 2016-04-16 DIAGNOSIS — Z79899 Other long term (current) drug therapy: Secondary | ICD-10-CM | POA: Insufficient documentation

## 2016-04-16 DIAGNOSIS — M79642 Pain in left hand: Secondary | ICD-10-CM

## 2016-04-16 DIAGNOSIS — S60222A Contusion of left hand, initial encounter: Secondary | ICD-10-CM | POA: Insufficient documentation

## 2016-04-16 DIAGNOSIS — S6992XA Unspecified injury of left wrist, hand and finger(s), initial encounter: Secondary | ICD-10-CM | POA: Diagnosis present

## 2016-04-16 DIAGNOSIS — Y92410 Unspecified street and highway as the place of occurrence of the external cause: Secondary | ICD-10-CM | POA: Insufficient documentation

## 2016-04-16 DIAGNOSIS — Y939 Activity, unspecified: Secondary | ICD-10-CM | POA: Diagnosis not present

## 2016-04-16 DIAGNOSIS — Y999 Unspecified external cause status: Secondary | ICD-10-CM | POA: Insufficient documentation

## 2016-04-16 DIAGNOSIS — M25532 Pain in left wrist: Secondary | ICD-10-CM | POA: Diagnosis not present

## 2016-04-16 DIAGNOSIS — M542 Cervicalgia: Secondary | ICD-10-CM | POA: Diagnosis not present

## 2016-04-16 MED ORDER — IBUPROFEN 800 MG PO TABS: 800.0000 mg | ORAL_TABLET | Freq: Three times a day (TID) | ORAL | 0 refills | Status: DC | PRN

## 2016-04-16 MED ORDER — IBUPROFEN 800 MG PO TABS
800.0000 mg | ORAL_TABLET | Freq: Once | ORAL | Status: AC
  Administered 2016-04-16: 800 mg via ORAL
  Filled 2016-04-16: qty 1

## 2016-04-16 NOTE — ED Notes (Signed)
Declined W/C at D/C and was escorted to lobby by RN. 
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
## 2016-05-22 DIAGNOSIS — M5481 Occipital neuralgia: Secondary | ICD-10-CM | POA: Diagnosis not present

## 2016-05-22 DIAGNOSIS — G43711 Chronic migraine without aura, intractable, with status migrainosus: Secondary | ICD-10-CM | POA: Diagnosis not present

## 2016-05-22 DIAGNOSIS — M542 Cervicalgia: Secondary | ICD-10-CM | POA: Diagnosis not present

## 2016-05-26 DIAGNOSIS — G43711 Chronic migraine without aura, intractable, with status migrainosus: Secondary | ICD-10-CM | POA: Diagnosis not present

## 2016-06-18 DIAGNOSIS — G43711 Chronic migraine without aura, intractable, with status migrainosus: Secondary | ICD-10-CM | POA: Diagnosis not present

## 2016-06-18 DIAGNOSIS — M542 Cervicalgia: Secondary | ICD-10-CM | POA: Diagnosis not present

## 2016-06-18 DIAGNOSIS — M5481 Occipital neuralgia: Secondary | ICD-10-CM | POA: Diagnosis not present
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
## 2017-11-17 LAB — OB RESULTS CONSOLE HIV ANTIBODY (ROUTINE TESTING): HIV: NONREACTIVE

## 2017-11-17 LAB — OB RESULTS CONSOLE RUBELLA ANTIBODY, IGM: Rubella: IMMUNE

## 2017-11-17 LAB — OB RESULTS CONSOLE ABO/RH: RH Type: POSITIVE

## 2017-11-17 LAB — OB RESULTS CONSOLE HEPATITIS B SURFACE ANTIGEN: HEP B S AG: NEGATIVE

## 2017-11-17 LAB — OB RESULTS CONSOLE RPR: RPR: NONREACTIVE

## 2018-05-07 LAB — OB RESULTS CONSOLE GBS: GBS: NEGATIVE

## 2018-05-19 ENCOUNTER — Encounter: Payer: Self-pay | Admitting: Pediatrics

## 2018-05-19 ENCOUNTER — Ambulatory Visit (INDEPENDENT_AMBULATORY_CARE_PROVIDER_SITE_OTHER): Payer: Self-pay | Admitting: Pediatrics

## 2018-05-19 DIAGNOSIS — Z7681 Expectant parent(s) prebirth pediatrician visit: Secondary | ICD-10-CM

## 2018-05-19 NOTE — Progress Notes (Signed)
Prenatal counseling for impending newborn done-- Z76.81  

## 2018-06-13 ENCOUNTER — Inpatient Hospital Stay (HOSPITAL_COMMUNITY)
Admission: AD | Admit: 2018-06-13 | Discharge: 2018-06-17 | DRG: 787 | Disposition: A | Discharge status: Home / Self Care | Payer: 59 | Attending: Obstetrics and Gynecology | Admitting: Obstetrics and Gynecology

## 2018-06-13 DIAGNOSIS — O9902 Anemia complicating childbirth: Secondary | ICD-10-CM

## 2018-06-13 DIAGNOSIS — Z3A40 40 weeks gestation of pregnancy: Secondary | ICD-10-CM

## 2018-06-13 DIAGNOSIS — O9081 Anemia of the puerperium: Secondary | ICD-10-CM | POA: Diagnosis not present

## 2018-06-13 DIAGNOSIS — O4292 Full-term premature rupture of membranes, unspecified as to length of time between rupture and onset of labor: Principal | ICD-10-CM | POA: Diagnosis present

## 2018-06-13 DIAGNOSIS — D62 Acute posthemorrhagic anemia: Secondary | ICD-10-CM | POA: Diagnosis not present

## 2018-06-13 DIAGNOSIS — Z98891 History of uterine scar from previous surgery: Secondary | ICD-10-CM

## 2018-06-13 DIAGNOSIS — Z3490 Encounter for supervision of normal pregnancy, unspecified, unspecified trimester: Secondary | ICD-10-CM

## 2018-06-13 DIAGNOSIS — Z87891 Personal history of nicotine dependence: Secondary | ICD-10-CM

## 2018-06-14 ENCOUNTER — Inpatient Hospital Stay (HOSPITAL_COMMUNITY): Payer: 59 | Admitting: Anesthesiology

## 2018-06-14 ENCOUNTER — Other Ambulatory Visit: Payer: Self-pay

## 2018-06-14 ENCOUNTER — Encounter (HOSPITAL_COMMUNITY): Payer: Self-pay | Admitting: *Deleted

## 2018-06-14 ENCOUNTER — Encounter (HOSPITAL_COMMUNITY)
Admission: AD | Disposition: A | Discharge status: Home / Self Care | Payer: Self-pay | Source: Home / Self Care | Attending: Obstetrics and Gynecology

## 2018-06-14 DIAGNOSIS — O4292 Full-term premature rupture of membranes, unspecified as to length of time between rupture and onset of labor: Secondary | ICD-10-CM | POA: Diagnosis present

## 2018-06-14 DIAGNOSIS — Z87891 Personal history of nicotine dependence: Secondary | ICD-10-CM | POA: Diagnosis not present

## 2018-06-14 DIAGNOSIS — D62 Acute posthemorrhagic anemia: Secondary | ICD-10-CM | POA: Diagnosis not present

## 2018-06-14 DIAGNOSIS — O9081 Anemia of the puerperium: Secondary | ICD-10-CM | POA: Diagnosis not present

## 2018-06-14 DIAGNOSIS — Z3A40 40 weeks gestation of pregnancy: Secondary | ICD-10-CM | POA: Diagnosis not present

## 2018-06-14 DIAGNOSIS — Z3490 Encounter for supervision of normal pregnancy, unspecified, unspecified trimester: Secondary | ICD-10-CM

## 2018-06-14 LAB — CBC
HCT: 39.9 % (ref 36.0–46.0)
Hemoglobin: 13.2 g/dL (ref 12.0–15.0)
MCH: 31.3 pg (ref 26.0–34.0)
MCHC: 33.1 g/dL (ref 30.0–36.0)
MCV: 94.5 fL (ref 80.0–100.0)
PLATELETS: 203 10*3/uL (ref 150–400)
RBC: 4.22 MIL/uL (ref 3.87–5.11)
RDW: 13.6 % (ref 11.5–15.5)
WBC: 13.5 10*3/uL — ABNORMAL HIGH (ref 4.0–10.5)
nRBC: 0 % (ref 0.0–0.2)

## 2018-06-14 LAB — TYPE AND SCREEN
ABO/RH(D): B POS
Antibody Screen: NEGATIVE

## 2018-06-14 LAB — RPR: RPR: NONREACTIVE

## 2018-06-14 LAB — ABO/RH: ABO/RH(D): B POS

## 2018-06-14 SURGERY — Surgical Case
Anesthesia: Epidural

## 2018-06-14 MED ORDER — ACETAMINOPHEN 500 MG PO TABS
1000.0000 mg | ORAL_TABLET | Freq: Four times a day (QID) | ORAL | Status: DC | PRN
  Administered 2018-06-14: 1000 mg via ORAL
  Filled 2018-06-14: qty 2

## 2018-06-14 MED ORDER — ALBUMIN HUMAN 5 % IV SOLN
12.5000 g | Freq: Once | INTRAVENOUS | Status: AC
  Administered 2018-06-14: 12.5 g via INTRAVENOUS
  Filled 2018-06-14: qty 250

## 2018-06-14 MED ORDER — SCOPOLAMINE 1 MG/3DAYS TD PT72: 1.0000 | MEDICATED_PATCH | Freq: Once | TRANSDERMAL | Status: DC

## 2018-06-14 MED ORDER — LACTATED RINGERS IV SOLN
500.0000 mL | Freq: Once | INTRAVENOUS | Status: AC
  Administered 2018-06-14: 500 mL via INTRAVENOUS

## 2018-06-14 MED ORDER — OXYTOCIN 40 UNITS IN NORMAL SALINE INFUSION - SIMPLE MED
1.0000 m[IU]/min | INTRAVENOUS | Status: DC
  Administered 2018-06-14: 1 m[IU]/min via INTRAVENOUS

## 2018-06-14 MED ORDER — NALOXONE HCL 4 MG/10ML IJ SOLN: 1.0000 ug/kg/h | INTRAVENOUS | Status: DC | PRN

## 2018-06-14 MED ORDER — NALBUPHINE HCL 10 MG/ML IJ SOLN: 5.0000 mg | Freq: Once | INTRAMUSCULAR | Status: DC | PRN

## 2018-06-14 MED ORDER — COCONUT OIL OIL: 1.0000 "application " | TOPICAL_OIL | Status: DC | PRN

## 2018-06-14 MED ORDER — DEXAMETHASONE SODIUM PHOSPHATE 4 MG/ML IJ SOLN
INTRAMUSCULAR | Status: AC
  Filled 2018-06-14: qty 1

## 2018-06-14 MED ORDER — ONDANSETRON HCL 4 MG/2ML IJ SOLN
INTRAMUSCULAR | Status: AC
  Filled 2018-06-14: qty 2

## 2018-06-14 MED ORDER — NALOXONE HCL 0.4 MG/ML IJ SOLN: 0.4000 mg | INTRAMUSCULAR | Status: DC | PRN

## 2018-06-14 MED ORDER — SIMETHICONE 80 MG PO CHEW: 80.0000 mg | CHEWABLE_TABLET | ORAL | Status: DC | PRN

## 2018-06-14 MED ORDER — FENTANYL CITRATE (PF) 100 MCG/2ML IJ SOLN
INTRAMUSCULAR | Status: DC | PRN
  Administered 2018-06-14: 100 ug via EPIDURAL

## 2018-06-14 MED ORDER — OXYTOCIN 40 UNITS IN NORMAL SALINE INFUSION - SIMPLE MED
2.5000 [IU]/h | INTRAVENOUS | Status: DC
  Filled 2018-06-14: qty 1000

## 2018-06-14 MED ORDER — LACTATED RINGERS IV SOLN
INTRAVENOUS | Status: DC
  Administered 2018-06-14: 01:00:00 via INTRAVENOUS

## 2018-06-14 MED ORDER — DIPHENHYDRAMINE HCL 50 MG/ML IJ SOLN: 12.5000 mg | INTRAMUSCULAR | Status: DC | PRN

## 2018-06-14 MED ORDER — TERBUTALINE SULFATE 1 MG/ML IJ SOLN: 0.2500 mg | Freq: Once | INTRAMUSCULAR | Status: DC | PRN

## 2018-06-14 MED ORDER — DIBUCAINE 1 % RE OINT: 1.0000 "application " | TOPICAL_OINTMENT | RECTAL | Status: DC | PRN

## 2018-06-14 MED ORDER — ONDANSETRON HCL 4 MG/2ML IJ SOLN: 4.0000 mg | Freq: Three times a day (TID) | INTRAMUSCULAR | Status: DC | PRN

## 2018-06-14 MED ORDER — MORPHINE SULFATE (PF) 0.5 MG/ML IJ SOLN
INTRAMUSCULAR | Status: DC | PRN
  Administered 2018-06-14: 3 mg via EPIDURAL

## 2018-06-14 MED ORDER — MORPHINE SULFATE (PF) 0.5 MG/ML IJ SOLN
INTRAMUSCULAR | Status: AC
  Filled 2018-06-14: qty 10

## 2018-06-14 MED ORDER — METHYLERGONOVINE MALEATE 0.2 MG/ML IJ SOLN: 0.2000 mg | INTRAMUSCULAR | Status: DC | PRN

## 2018-06-14 MED ORDER — ACETAMINOPHEN 10 MG/ML IV SOLN: 1000.0000 mg | Freq: Once | INTRAVENOUS | Status: DC | PRN

## 2018-06-14 MED ORDER — SOD CITRATE-CITRIC ACID 500-334 MG/5ML PO SOLN: 30.0000 mL | ORAL | Status: DC | PRN

## 2018-06-14 MED ORDER — ALBUMIN HUMAN 5 % IV SOLN
INTRAVENOUS | Status: AC
  Filled 2018-06-14: qty 250

## 2018-06-14 MED ORDER — LACTATED RINGERS IV SOLN
INTRAVENOUS | Status: DC
  Administered 2018-06-15: 06:00:00 via INTRAVENOUS

## 2018-06-14 MED ORDER — FLEET ENEMA 7-19 GM/118ML RE ENEM: 1.0000 | ENEMA | RECTAL | Status: DC | PRN

## 2018-06-14 MED ORDER — SIMETHICONE 80 MG PO CHEW
80.0000 mg | CHEWABLE_TABLET | ORAL | Status: DC
  Administered 2018-06-14 – 2018-06-16 (×3): 80 mg via ORAL
  Filled 2018-06-14 (×3): qty 1

## 2018-06-14 MED ORDER — METHYLERGONOVINE MALEATE 0.2 MG PO TABS: 0.2000 mg | ORAL_TABLET | ORAL | Status: DC | PRN

## 2018-06-14 MED ORDER — OXYCODONE-ACETAMINOPHEN 5-325 MG PO TABS
1.0000 | ORAL_TABLET | ORAL | Status: DC | PRN
  Administered 2018-06-15 – 2018-06-17 (×4): 1 via ORAL
  Filled 2018-06-14 (×4): qty 1

## 2018-06-14 MED ORDER — SODIUM CHLORIDE 0.9 % IR SOLN
Status: DC | PRN
  Administered 2018-06-14: 1000 mL

## 2018-06-14 MED ORDER — EPHEDRINE 5 MG/ML INJ: 10.0000 mg | INTRAVENOUS | Status: DC | PRN

## 2018-06-14 MED ORDER — LACTATED RINGERS IV SOLN
INTRAVENOUS | Status: DC
  Administered 2018-06-14 (×4): via INTRAVENOUS

## 2018-06-14 MED ORDER — NALBUPHINE HCL 10 MG/ML IJ SOLN: 5.0000 mg | INTRAMUSCULAR | Status: DC | PRN

## 2018-06-14 MED ORDER — KETOROLAC TROMETHAMINE 30 MG/ML IJ SOLN
30.0000 mg | Freq: Four times a day (QID) | INTRAMUSCULAR | Status: AC
  Administered 2018-06-15 (×2): 30 mg via INTRAVENOUS
  Filled 2018-06-14 (×2): qty 1

## 2018-06-14 MED ORDER — FENTANYL-BUPIVACAINE-NACL 0.5-0.125-0.9 MG/250ML-% EP SOLN
12.0000 mL/h | EPIDURAL | Status: DC | PRN
  Filled 2018-06-14: qty 250

## 2018-06-14 MED ORDER — ACETAMINOPHEN 325 MG PO TABS: 650.0000 mg | ORAL_TABLET | ORAL | Status: DC | PRN

## 2018-06-14 MED ORDER — OXYTOCIN 40 UNITS IN NORMAL SALINE INFUSION - SIMPLE MED
1.0000 m[IU]/min | INTRAVENOUS | Status: DC
  Administered 2018-06-14: 2 m[IU]/min via INTRAVENOUS

## 2018-06-14 MED ORDER — OXYCODONE-ACETAMINOPHEN 5-325 MG PO TABS: 1.0000 | ORAL_TABLET | ORAL | Status: DC | PRN

## 2018-06-14 MED ORDER — PHENYLEPHRINE 40 MCG/ML (10ML) SYRINGE FOR IV PUSH (FOR BLOOD PRESSURE SUPPORT)
80.0000 ug | PREFILLED_SYRINGE | INTRAVENOUS | Status: DC | PRN
  Administered 2018-06-14: 200 ug via INTRAVENOUS

## 2018-06-14 MED ORDER — SODIUM CHLORIDE 0.9% FLUSH: 3.0000 mL | INTRAVENOUS | Status: DC | PRN

## 2018-06-14 MED ORDER — KETOROLAC TROMETHAMINE 30 MG/ML IJ SOLN
30.0000 mg | Freq: Once | INTRAMUSCULAR | Status: AC | PRN
  Administered 2018-06-14: 30 mg via INTRAVENOUS

## 2018-06-14 MED ORDER — DEXAMETHASONE SODIUM PHOSPHATE 10 MG/ML IJ SOLN
INTRAMUSCULAR | Status: DC | PRN
  Administered 2018-06-14: 10 mg via INTRAVENOUS

## 2018-06-14 MED ORDER — LACTATED RINGERS IV BOLUS
500.0000 mL | Freq: Once | INTRAVENOUS | Status: AC
  Administered 2018-06-14: 250 mL via INTRAVENOUS

## 2018-06-14 MED ORDER — ONDANSETRON HCL 4 MG/2ML IJ SOLN
4.0000 mg | Freq: Four times a day (QID) | INTRAMUSCULAR | Status: DC | PRN
  Administered 2018-06-14: 4 mg via INTRAVENOUS
  Filled 2018-06-14: qty 2

## 2018-06-14 MED ORDER — PHENYLEPHRINE 40 MCG/ML (10ML) SYRINGE FOR IV PUSH (FOR BLOOD PRESSURE SUPPORT)
80.0000 ug | PREFILLED_SYRINGE | INTRAVENOUS | Status: DC | PRN
  Filled 2018-06-14: qty 10

## 2018-06-14 MED ORDER — LACTATED RINGERS IV SOLN: 500.0000 mL | INTRAVENOUS | Status: DC | PRN

## 2018-06-14 MED ORDER — DIPHENHYDRAMINE HCL 25 MG PO CAPS: 25.0000 mg | ORAL_CAPSULE | Freq: Four times a day (QID) | ORAL | Status: DC | PRN

## 2018-06-14 MED ORDER — FENTANYL CITRATE (PF) 100 MCG/2ML IJ SOLN
INTRAMUSCULAR | Status: AC
  Filled 2018-06-14: qty 2

## 2018-06-14 MED ORDER — OXYCODONE-ACETAMINOPHEN 5-325 MG PO TABS: 2.0000 | ORAL_TABLET | ORAL | Status: DC | PRN

## 2018-06-14 MED ORDER — BUPIVACAINE HCL (PF) 0.25 % IJ SOLN
INTRAMUSCULAR | Status: DC | PRN
  Administered 2018-06-14: 20 mL

## 2018-06-14 MED ORDER — BUPIVACAINE HCL (PF) 0.25 % IJ SOLN
INTRAMUSCULAR | Status: AC
  Filled 2018-06-14: qty 30

## 2018-06-14 MED ORDER — SENNOSIDES-DOCUSATE SODIUM 8.6-50 MG PO TABS
2.0000 | ORAL_TABLET | ORAL | Status: DC
  Administered 2018-06-14 – 2018-06-16 (×3): 2 via ORAL
  Filled 2018-06-14 (×3): qty 2

## 2018-06-14 MED ORDER — OXYTOCIN BOLUS FROM INFUSION: 500.0000 mL | Freq: Once | INTRAVENOUS | Status: DC

## 2018-06-14 MED ORDER — KETOROLAC TROMETHAMINE 30 MG/ML IJ SOLN
INTRAMUSCULAR | Status: AC
  Filled 2018-06-14: qty 1

## 2018-06-14 MED ORDER — ONDANSETRON HCL 4 MG/2ML IJ SOLN
INTRAMUSCULAR | Status: DC | PRN
  Administered 2018-06-14: 4 mg via INTRAVENOUS

## 2018-06-14 MED ORDER — MENTHOL 3 MG MT LOZG: 1.0000 | LOZENGE | OROMUCOSAL | Status: DC | PRN

## 2018-06-14 MED ORDER — LACTATED RINGERS IV SOLN
INTRAVENOUS | Status: DC | PRN
  Administered 2018-06-14: 18:00:00 via INTRAVENOUS

## 2018-06-14 MED ORDER — SODIUM CHLORIDE (PF) 0.9 % IJ SOLN
INTRAMUSCULAR | Status: DC | PRN
  Administered 2018-06-14: 12 mL/h via EPIDURAL

## 2018-06-14 MED ORDER — TETANUS-DIPHTH-ACELL PERTUSSIS 5-2.5-18.5 LF-MCG/0.5 IM SUSP: 0.5000 mL | Freq: Once | INTRAMUSCULAR | Status: DC

## 2018-06-14 MED ORDER — PRENATAL MULTIVITAMIN CH
1.0000 | ORAL_TABLET | Freq: Every day | ORAL | Status: DC
  Administered 2018-06-15 – 2018-06-17 (×3): 1 via ORAL
  Filled 2018-06-14 (×3): qty 1

## 2018-06-14 MED ORDER — ZOLPIDEM TARTRATE 5 MG PO TABS: 5.0000 mg | ORAL_TABLET | Freq: Every evening | ORAL | Status: DC | PRN

## 2018-06-14 MED ORDER — LIDOCAINE-EPINEPHRINE (PF) 2 %-1:200000 IJ SOLN
INTRAMUSCULAR | Status: DC | PRN
  Administered 2018-06-14: 3 mL via EPIDURAL
  Administered 2018-06-14: 5 mL via EPIDURAL
  Administered 2018-06-14 (×3): 4 mL via EPIDURAL

## 2018-06-14 MED ORDER — OXYTOCIN 40 UNITS IN NORMAL SALINE INFUSION - SIMPLE MED
INTRAVENOUS | Status: AC
  Filled 2018-06-14: qty 1000

## 2018-06-14 MED ORDER — LIDOCAINE 2% (20 MG/ML) 5 ML SYRINGE
INTRAMUSCULAR | Status: AC
  Filled 2018-06-14: qty 5

## 2018-06-14 MED ORDER — SODIUM CHLORIDE 0.9 % IV SOLN
2.0000 g | Freq: Two times a day (BID) | INTRAVENOUS | Status: DC
  Administered 2018-06-14 – 2018-06-15 (×3): 2 g via INTRAVENOUS
  Filled 2018-06-14 (×7): qty 2

## 2018-06-14 MED ORDER — WITCH HAZEL-GLYCERIN EX PADS: 1.0000 "application " | MEDICATED_PAD | CUTANEOUS | Status: DC | PRN

## 2018-06-14 MED ORDER — SIMETHICONE 80 MG PO CHEW
80.0000 mg | CHEWABLE_TABLET | Freq: Three times a day (TID) | ORAL | Status: DC
  Administered 2018-06-15 – 2018-06-17 (×7): 80 mg via ORAL
  Filled 2018-06-14 (×6): qty 1

## 2018-06-14 MED ORDER — LIDOCAINE HCL (PF) 1 % IJ SOLN: 30.0000 mL | INTRAMUSCULAR | Status: DC | PRN

## 2018-06-14 MED ORDER — OXYTOCIN 40 UNITS IN NORMAL SALINE INFUSION - SIMPLE MED: 2.5000 [IU]/h | INTRAVENOUS | Status: AC

## 2018-06-14 MED ORDER — SCOPOLAMINE 1 MG/3DAYS TD PT72
MEDICATED_PATCH | TRANSDERMAL | Status: AC
  Filled 2018-06-14: qty 1

## 2018-06-14 MED ORDER — DIPHENHYDRAMINE HCL 25 MG PO CAPS: 25.0000 mg | ORAL_CAPSULE | ORAL | Status: DC | PRN

## 2018-06-14 MED ORDER — IBUPROFEN 800 MG PO TABS
800.0000 mg | ORAL_TABLET | Freq: Four times a day (QID) | ORAL | Status: DC
  Administered 2018-06-15 – 2018-06-17 (×8): 800 mg via ORAL
  Filled 2018-06-14 (×7): qty 1

## 2018-06-14 SURGICAL SUPPLY — 34 items
BENZOIN TINCTURE PRP APPL 2/3 (GAUZE/BANDAGES/DRESSINGS) ×2 IMPLANT
CHLORAPREP W/TINT 26ML (MISCELLANEOUS) ×2 IMPLANT
CLAMP CORD UMBIL (MISCELLANEOUS) IMPLANT
CLOTH BEACON ORANGE TIMEOUT ST (SAFETY) ×2 IMPLANT
DRSG OPSITE POSTOP 4X10 (GAUZE/BANDAGES/DRESSINGS) ×2 IMPLANT
ELECT REM PT RETURN 9FT ADLT (ELECTROSURGICAL) ×2
ELECTRODE REM PT RTRN 9FT ADLT (ELECTROSURGICAL) ×1 IMPLANT
EXTRACTOR VACUUM M CUP 4 TUBE (SUCTIONS) IMPLANT
GLOVE BIO SURGEON STRL SZ7.5 (GLOVE) ×2 IMPLANT
GLOVE BIOGEL PI IND STRL 7.0 (GLOVE) ×1 IMPLANT
GLOVE BIOGEL PI INDICATOR 7.0 (GLOVE) ×1
GOWN STRL REUS W/TWL LRG LVL3 (GOWN DISPOSABLE) ×4 IMPLANT
KIT ABG SYR 3ML LUER SLIP (SYRINGE) IMPLANT
NEEDLE HYPO 22GX1.5 SAFETY (NEEDLE) ×2 IMPLANT
NEEDLE HYPO 25X5/8 SAFETYGLIDE (NEEDLE) IMPLANT
NEEDLE SPNL 20GX3.5 QUINCKE YW (NEEDLE) IMPLANT
NS IRRIG 1000ML POUR BTL (IV SOLUTION) ×2 IMPLANT
PACK C SECTION WH (CUSTOM PROCEDURE TRAY) ×2 IMPLANT
PENCIL SMOKE EVAC W/HOLSTER (ELECTROSURGICAL) ×2 IMPLANT
STRIP CLOSURE SKIN 1/2X4 (GAUZE/BANDAGES/DRESSINGS) ×2 IMPLANT
SUT MNCRL 0 VIOLET CTX 36 (SUTURE) ×2 IMPLANT
SUT MNCRL AB 3-0 PS2 27 (SUTURE) IMPLANT
SUT MON AB 2-0 CT1 27 (SUTURE) ×2 IMPLANT
SUT MON AB-0 CT1 36 (SUTURE) ×4 IMPLANT
SUT MONOCRYL 0 CTX 36 (SUTURE) ×2
SUT PLAIN 0 NONE (SUTURE) IMPLANT
SUT PLAIN 2 0 (SUTURE)
SUT PLAIN 2 0 XLH (SUTURE) IMPLANT
SUT PLAIN ABS 2-0 CT1 27XMFL (SUTURE) IMPLANT
SYR 20CC LL (SYRINGE) IMPLANT
SYR CONTROL 10ML LL (SYRINGE) ×2 IMPLANT
TOWEL OR 17X24 6PK STRL BLUE (TOWEL DISPOSABLE) ×2 IMPLANT
TRAY FOLEY W/BAG SLVR 14FR LF (SET/KITS/TRAYS/PACK) ×2 IMPLANT
WATER STERILE IRR 1000ML POUR (IV SOLUTION) ×2 IMPLANT

## 2018-06-14 NOTE — Progress Notes (Signed)
S: Doing well, no complaints, pain well controlled with epidural  O: BP 106/86   Pulse (!) 106   Temp 98.3 F (36.8 C) (Oral)   Resp 18   Ht 5\' 7"  (1.702 m)   Wt 77.1 kg   SpO2 99%   BMI 26.63 kg/m    FHT:  FHR: 170s bpm, variability: minimal ,  accelerations:  Abscent,  decelerations:  Absent UC:   regular, every 1 minutes SVE:   Dilation: 7.5 Effacement (%): 100 Station: 0 Exam by:: Dr. Ernestina Penna   A / P:  28 y.o.  OB History  Gravida Para Term Preterm AB Living  1 0 0 0 0 0  SAB TAB Ectopic Multiple Live Births  0 0 0 0 0   at [redacted]w[redacted]d augmentation of labor for PROM, pitocin stopped due to tachysystole, ctx q70min have continued for the past hour with pitocin off. some change by difference examiners in past hour, continue expectant management  Fetal Wellbeing:  Category II, scalp stimulation present but no true accels, no decels. Pain Control:  Epidural  Anticipated MOD:  NSVD, pelvis with adequate room, pt has made consistent change.   Lendon Colonel 06/14/2018, 1:51 PM

## 2018-06-14 NOTE — H&P (Signed)
Mia Baker is a 28 y.o. G1P0 at 109w3d presenting for rupture of membranes in early labor. Pt notes onset contractions around 12:30 AM. Good fetal movement, No vaginal bleeding, started leaking fluid at 11:45 PM last night.  [facility] at [facility] since 7 Wks -Dated by 7-week ultrasound not consistent with dates -History of drug use, rare, stopped at 5 weeks when learning of pregnancy -Penicillin allergy as a child   Prenatal Transfer Tool  Maternal Diabetes: No Genetic Screening: Normal Maternal Ultrasounds/Referrals: Normal Fetal Ultrasounds or other Referrals:  None Maternal Substance Abuse:  No Significant Maternal Medications:  None Significant Maternal Lab Results: None     OB History    Gravida  1   Para      Term      Preterm      AB      Living        SAB      TAB      Ectopic      Multiple      Live Births             Past Medical History:  Diagnosis Date  . Allergy   . Migraines    Past Surgical History:  Procedure Laterality Date  . APPENDECTOMY    . LAPAROSCOPIC APPENDECTOMY N/A 01/19/2015   Procedure: APPENDECTOMY LAPAROSCOPIC;  Surgeon: Darnell Level, MD;  Location: [facility];  Service: General;  Laterality: N/A;  . WISDOM TOOTH EXTRACTION     Family History: family history includes Anemia in her mother; Cancer in her maternal grandmother; Hypertension in her father. Social History:  reports that she has quit smoking. She has never used smokeless tobacco. She reports current alcohol use of about 5.0 standard drinks of alcohol per week. She reports current drug use.  Review of Systems - Negative except Leaking fluid, contractions   Dilation: 4.5 Effacement (%): 80 Station: -1 Exam by:: Dr. Ernestina Penna Blood pressure 115/66, pulse 77, temperature 98 F (36.7 C), temperature source Oral, resp. rate 20, height 5\' 7"  (1.702 m), weight 77.1 kg, SpO2 99 %.  Physical Exam:  Gen: well appearing, no distress  Abd: gravid, NT, no  RUQ pain LE: No edema, equal bilaterally, non-tender Toco: Irritability every FH: baseline 140s, accelerations present, no deceleratons, 10 beat variability  Prenatal labs: ABO, Rh: --/--/B POS, B POS Performed at [facility], [address] [place], [place] [address]  [phone]) Antibody: NEG (03/09 0058) Rubella: Immune (08/13 0000) RPR: Nonreactive (08/13 0000)  HBsAg: Negative (08/13 0000)  HIV: Non-reactive (08/13 0000)  GBS: Negative (01/31 0000)  1 hr Glucola 109  Genetic screening normal NT, normal AFP Anatomy US normal   Assessment/Plan: 28 y.o. G1P0 at [redacted]w[redacted]d -Premature rupture of membranes at term.  Patient admitted and 6 hours after admit no change of cervix noted and Pitocin started.  Pitocin quickly stopped by nurse for tachysystole and patient's discomfort.  Since then patient has received epidural, IUPC placed which shows very low [place] units and will resume Pitocin. -Epidural for pain -Reactive fetal testing though will keep close eye given the amount of bloody tinged amniotic fluid -GBS negative  Lendon Colonel 06/14/2018 8:32 AM     Lendon Colonel 06/14/2018, 8:28 AM

## 2018-06-14 NOTE — MAU Note (Signed)
Water brooke around 2345, clear fluid.

## 2018-06-14 NOTE — Transfer of Care (Signed)
Immediate Anesthesia Transfer of Care Note  Patient: Mia Baker  Procedure(s) Performed: CESAREAN SECTION (N/A )  Patient Location: PACU  Anesthesia Type:Epidural  Level of Consciousness: awake, alert , oriented and patient cooperative  Airway & Oxygen Therapy: Patient Spontanous Breathing  Post-op Assessment: Report given to RN and Post -op Vital signs reviewed and stable  Post vital signs: Reviewed and stable  Last Vitals:  Vitals Value Taken Time  BP 83/37 06/14/2018  6:20 PM  Temp    Pulse 113 06/14/2018  6:21 PM  Resp 13 06/14/2018  6:21 PM  SpO2 97 % 06/14/2018  6:21 PM  Vitals shown include unvalidated device data.  Last Pain:  Vitals:   06/14/18 1820  TempSrc: (P) Oral  PainSc: (P) 6          Complications: No apparent anesthesia complications

## 2018-06-14 NOTE — Anesthesia Postprocedure Evaluation (Signed)
Anesthesia Post Note  Patient: Mia Baker  Procedure(s) Performed: CESAREAN SECTION (N/A )     Patient location during evaluation: PACU Anesthesia Type: Epidural Level of consciousness: awake and alert and oriented Pain management: pain level controlled Vital Signs Assessment: post-procedure vital signs reviewed and stable Respiratory status: spontaneous breathing, nonlabored ventilation and respiratory function stable Cardiovascular status: blood pressure returned to baseline and stable Postop Assessment: no headache, no backache, epidural receding, patient able to bend at knees and no apparent nausea or vomiting Anesthetic complications: no Comments: Patient had slightly low BP in PACU with decreased UOP. Given 5% Albumin with increase in BP and UOP. Patient stable for D/C to floor.    Last Vitals:  Vitals:   06/14/18 1930 06/14/18 1945  BP: 104/61 108/68  Pulse: (!) 104 98  Resp: 20 (!) 27  Temp:    SpO2: 97% 96%    Last Pain:  Vitals:   06/14/18 1930  TempSrc:   PainSc: 3    Pain Goal:    LLE Motor Response: Purposeful movement (06/14/18 1930)   RLE Motor Response: Purposeful movement (06/14/18 1930)       Epidural/Spinal Function Cutaneous sensation: Able to Wiggle Toes (06/14/18 1930), Patient able to flex knees: No (06/14/18 1930), Patient able to lift hips off bed: No (06/14/18 1930), Back pain beyond tenderness at insertion site: No (06/14/18 1930), Progressively worsening motor and/or sensory loss: No (06/14/18 1930), Bowel and/or bladder incontinence post epidural: No (06/14/18 1930)  Amiere Cawley A.

## 2018-06-14 NOTE — Anesthesia Pain Management Evaluation Note (Signed)
  CRNA Pain Management Visit Note  Patient: Mia Baker, 28 y.o., female  "Hello I am a member of the anesthesia team at [facility] [employer]. We have an anesthesia team available at all times to provide care throughout the hospital, including epidural management and anesthesia for C-section. I don't know your plan for the delivery whether it a natural birth, water birth, IV sedation, nitrous supplementation, doula or epidural, but we want to meet your pain goals."   1.Was your pain managed to your expectations on prior hospitalizations?   No prior hospitalizations  2.What is your expectation for pain management during this hospitalization?     Epidural  3.How can we help you reach that goal? Maintain epidural until delivery of infant.  Record the patient's initial score and the patient's pain goal.   Pain: 3  Pain Goal: 3 [facility] wants you to be able to say your pain was always managed very well.  Harley Mccartney 06/14/2018

## 2018-06-14 NOTE — Anesthesia Preprocedure Evaluation (Signed)
Anesthesia Evaluation  Patient identified by MRN, date of birth, ID band Patient awake    Reviewed: Allergy & Precautions, NPO status , Patient's Chart, lab work & pertinent test results  Airway Mallampati: I  TM Distance: >3 FB Neck ROM: Full    Dental no notable dental hx.    Pulmonary neg pulmonary ROS, former smoker,    Pulmonary exam normal breath sounds clear to auscultation       Cardiovascular negative cardio ROS Normal cardiovascular exam Rhythm:Regular Rate:Normal     Neuro/Psych  Headaches, PSYCHIATRIC DISORDERS Anxiety    GI/Hepatic negative GI ROS, Neg liver ROS,   Endo/Other  negative endocrine ROS  Renal/GU negative Renal ROS  negative genitourinary   Musculoskeletal negative musculoskeletal ROS (+)   Abdominal   Peds  Hematology negative hematology ROS (+)   Anesthesia Other Findings   Reproductive/Obstetrics (+) Pregnancy                             Anesthesia Physical Anesthesia Plan  ASA: II  Anesthesia Plan: Epidural   Post-op Pain Management:    Induction:   PONV Risk Score and Plan: Treatment may vary due to age or medical condition  Airway Management Planned: Natural Airway  Additional Equipment:   Intra-op Plan:   Post-operative Plan:   Informed Consent: I have reviewed the patients History and Physical, chart, labs and discussed the procedure including the risks, benefits and alternatives for the proposed anesthesia with the patient or authorized representative who has indicated his/her understanding and acceptance.       Plan Discussed with: Anesthesiologist  Anesthesia Plan Comments: (Patient identified. Risks, benefits, options discussed with patient including but not limited to bleeding, infection, nerve damage, paralysis, failed block, incomplete pain control, headache, blood pressure changes, nausea, vomiting, reactions to medication,  itching, and post partum back pain. Confirmed with bedside nurse the patient's most recent platelet count. Confirmed with the patient that they are not taking any anticoagulation, have any bleeding history or any family history of bleeding disorders. Patient expressed understanding and wishes to proceed. All questions were answered. )        Anesthesia Quick Evaluation

## 2018-06-14 NOTE — Progress Notes (Addendum)
Mia Baker is a 28 y.o. G1P0 at [redacted]w[redacted]d by LMP admitted for active labor, rupture of membranes  Subjective: Comfortable  Objective: BP (!) 102/38   Pulse (!) 118   Temp (!) 102 F (38.9 C) (Oral)   Resp 18   Ht 5\' 7"  (1.702 m)   Wt 77.1 kg   SpO2 99%   BMI 26.63 kg/m  No intake/output data recorded. Total I/O In: -  Out: 2300 [Urine:2300]  FHT:  FHR: 200 bpm, variability: minimal ,  accelerations:  Abscent,  decelerations:  Absent UC:   regular, every 2 minutes SVE:   Dilation: 8 Effacement (%): 100 Station: 0 Exam by:: Dr. Billy Baker  Adequate by IUPC No change since 1220  Labs: Lab Results  Component Value Date   WBC 13.5 (H) 06/14/2018   HGB 13.2 06/14/2018   HCT 39.9 06/14/2018   MCV 94.5 06/14/2018   PLT 203 06/14/2018    Assessment / Plan: Arrest in active phase of labor  Fetal tachycardia secondary to presumed chorio  Labor: no progress Preeclampsia:  no signs or symptoms of toxicity Fetal Wellbeing:  Category I and Category II Pain Control:  Epidural I/D:  n/a Anticipated MOD:  Proceed with csection. Consent done.  Mia Baker J 06/14/2018, 4:59 PM

## 2018-06-14 NOTE — Anesthesia Procedure Notes (Signed)
Epidural Patient location during procedure: OB Start time: 06/14/2018 7:00 AM End time: 06/14/2018 7:15 AM  Staffing Anesthesiologist: Elmer Picker, MD Performed: anesthesiologist   Preanesthetic Checklist Completed: patient identified, pre-op evaluation, timeout performed, IV checked, risks and benefits discussed and monitors and equipment checked  Epidural Patient position: sitting Prep: site prepped and draped and DuraPrep Patient monitoring: continuous pulse ox, blood pressure, heart rate and cardiac monitor Approach: midline Location: L3-L4 Injection technique: LOR air  Needle:  Needle type: Tuohy  Needle gauge: 17 G Needle length: 9 cm Needle insertion depth: 5 cm Catheter type: closed end flexible Catheter size: 19 Gauge Catheter at skin depth: 10 cm Test dose: negative  Assessment Sensory level: T8 Events: blood not aspirated, injection not painful, no injection resistance, negative IV test and no paresthesia  Additional Notes Patient identified. Risks/Benefits/Options discussed with patient including but not limited to bleeding, infection, nerve damage, paralysis, failed block, incomplete pain control, headache, blood pressure changes, nausea, vomiting, reactions to medication both or allergic, itching and postpartum back pain. Confirmed with bedside nurse the patient's most recent platelet count. Confirmed with patient that they are not currently taking any anticoagulation, have any bleeding history or any family history of bleeding disorders. Patient expressed understanding and wished to proceed. All questions were answered. Sterile technique was used throughout the entire procedure. Please see nursing notes for vital signs. Test dose was given through epidural catheter and negative prior to continuing to dose epidural or start infusion. Warning signs of high block given to the patient including shortness of breath, tingling/numbness in hands, complete motor block, or  any concerning symptoms with instructions to call for help. Patient was given instructions on fall risk and not to get out of bed. All questions and concerns addressed with instructions to call with any issues or inadequate analgesia.  Reason for block:procedure for pain

## 2018-06-14 NOTE — Lactation Note (Signed)
This note was copied from a Mia's chart. Lactation Consultation Note  Patient Name: Mia Baker XYDSW'V Date: 06/14/2018  Mia Baker now 4 hours old.  Room full of visitors.  Mom reports no breastfeeding education. Mom reports she has bf twice and feels she did well.  Mom reports she has 1 set of visitors to come later and then she may call lactation.  Mom reports she wants to breastfeed.  G1p1 csection delivery.  Mom with hx of cocaine and mj use.  Reviewed [facility] breastfeeding consultation servies and breastfeeding resource groups. Gave spoon and encouraged dessert past bf of breastmilk in a spoon. Dad holding infant at this time.  Urged to call lactation as needed.  Maternal Data    Feeding Feeding Type: Breast Fed  LATCH Score Latch: Grasps breast easily, tongue down, lips flanged, rhythmical sucking.  Audible Swallowing: Spontaneous and intermittent  Type of Nipple: Everted at rest and after stimulation  Comfort (Breast/Nipple): Soft / non-tender  Hold (Positioning): Full assist, staff holds infant at breast  [facility] Score: 8  Interventions    Lactation Tools Discussed/Used     Consult Status      Mia Baker 06/14/2018, 10:11 PM

## 2018-06-14 NOTE — Progress Notes (Signed)
Chart review/ discussed with nurse  Pt comfortable, no concerns  Epidural, pitocin, IUPC, SROM/ PROM  O: BP 127/82   Pulse 68   Temp (!) 97.2 F (36.2 C) (Axillary)   Resp 20   Ht 5\' 7"  (1.702 m)   Wt 77.1 kg   SpO2 99%   BMI 26.63 kg/m    FHT:  FHR: 150s bpm, variability: moderate,  accelerations:  Present,  decelerations:  Absent UC:   regular, every 1.5 minutes SVE:   Dilation: 5.5 Effacement (%): 80 Station: -1 Exam by:: Sandy Salaam, RN    A / P:  28 y.o.  OB History  Gravida Para Term Preterm AB Living  1 0 0 0 0 0  SAB TAB Ectopic Multiple Live Births  0 0 0 0 0   at [redacted]w[redacted]d Augmentation of labor after PROM, adequate progress, continue pitocin. Adequate MVU  Fetal Wellbeing:  Category I Pain Control:  Epidural  Anticipated MOD:  NSVD  Lendon Colonel 06/14/2018, 11:49 AM

## 2018-06-14 NOTE — Op Note (Signed)
Cesarean Section Procedure Note  Indications: failure to progress: arrest of dilation and non-reassuring fetal status  Pre-operative Diagnosis: 40 week 3 day pregnancy.  Post-operative Diagnosis: same  Surgeon: Lenoard Aden   Assistants: Sigmon,CNM  Anesthesia: Epidural anesthesia and Local anesthesia 0.25.% bupivacaine  ASA Class: 2  Procedure Details  The patient was seen in the Holding Room. The risks, benefits, complications, treatment options, and expected outcomes were discussed with the patient.  The patient concurred with the proposed plan, giving informed consent. The risks of anesthesia, infection, bleeding and possible injury to other organs discussed. Injury to bowel, bladder, or ureter with possible need for repair discussed. Possible need for transfusion with secondary risks of hepatitis or HIV acquisition discussed. Post operative complications to include but not limited to DVT, PE and Pneumonia noted. The site of surgery properly noted/marked. The patient was taken to Operating Room # C, identified as Yovana Rafaella Kinlaw and the procedure verified as C-Section Delivery. A Time Out was held and the above information confirmed.  After induction of anesthesia, the patient was draped and prepped in the usual sterile manner. A Pfannenstiel incision was made and carried down through the subcutaneous tissue to the fascia. Fascial incision was made and extended transversely using Mayo scissors. The fascia was separated from the underlying rectus tissue superiorly and inferiorly. The peritoneum was identified and entered. Peritoneal incision was extended longitudinally. The utero-vesical peritoneal reflection was incised transversely and the bladder flap was bluntly freed from the lower uterine segment. A low transverse uterine incision(Kerr hysterotomy) was made. Delivered from OP presentation was a  female with Apgar scores of 8 at one minute and 9 at five minutes. Bulb suctioning  gently performed. Neonatal team in attendance.After the umbilical cord was clamped and cut cord blood was obtained for evaluation. The placenta was removed intact and appeared normal. The uterus was curetted with a dry lap pack. Good hemostasis was noted.The uterine outline, tubes and ovaries appeared normal. The uterine incision was closed with running locked sutures of 0 Monocryl x 2 layers. Hemostasis was observed. Lavage was carried out until clear.The parietal peritoneum was closed with a running 2-0 Monocryl suture. The fascia was then reapproximated with running sutures of 0 Monocryl. The skin was reapproximated with 3-0 monocryl after Schuyler closure with 2-0 plain.  Instrument, sponge, and needle counts were correct prior the abdominal closure and at the conclusion of the case.   Findings: FTLF, OP, anterior placenta, nl adnexa  Estimated Blood Loss:  600         Drains: foley                 Specimens: placenta                 Complications:  None; patient tolerated the procedure well.         Disposition: PACU - hemodynamically stable.         Condition: stable  Attending Attestation: I performed the procedure.

## 2018-06-15 ENCOUNTER — Encounter (HOSPITAL_COMMUNITY): Payer: Self-pay | Admitting: Obstetrics and Gynecology

## 2018-06-15 DIAGNOSIS — Z98891 History of uterine scar from previous surgery: Secondary | ICD-10-CM

## 2018-06-15 DIAGNOSIS — O9902 Anemia complicating childbirth: Secondary | ICD-10-CM

## 2018-06-15 HISTORY — DX: History of uterine scar from previous surgery: Z98.891

## 2018-06-15 HISTORY — DX: Anemia complicating childbirth: O99.02

## 2018-06-15 LAB — CBC
HCT: 27 % — ABNORMAL LOW (ref 36.0–46.0)
Hemoglobin: 9.2 g/dL — ABNORMAL LOW (ref 12.0–15.0)
MCH: 31.8 pg (ref 26.0–34.0)
MCHC: 34.1 g/dL (ref 30.0–36.0)
MCV: 93.4 fL (ref 80.0–100.0)
Platelets: 148 10*3/uL — ABNORMAL LOW (ref 150–400)
RBC: 2.89 MIL/uL — ABNORMAL LOW (ref 3.87–5.11)
RDW: 14 % (ref 11.5–15.5)
WBC: 23.1 10*3/uL — ABNORMAL HIGH (ref 4.0–10.5)
nRBC: 0 % (ref 0.0–0.2)

## 2018-06-15 MED ORDER — MAGNESIUM OXIDE 400 (241.3 MG) MG PO TABS
400.0000 mg | ORAL_TABLET | Freq: Every day | ORAL | Status: DC
  Administered 2018-06-15 – 2018-06-17 (×3): 400 mg via ORAL
  Filled 2018-06-15 (×3): qty 1

## 2018-06-15 MED ORDER — POLYSACCHARIDE IRON COMPLEX 150 MG PO CAPS
150.0000 mg | ORAL_CAPSULE | Freq: Every day | ORAL | Status: DC
  Administered 2018-06-15 – 2018-06-17 (×3): 150 mg via ORAL
  Filled 2018-06-15 (×3): qty 1

## 2018-06-15 NOTE — Clinical Social Work Maternal (Addendum)
CLINICAL SOCIAL WORK MATERNAL/CHILD NOTE  Patient Details  Name: Mia Baker MRN: [id number] Date of Birth: Jun 29, 1990  Date:  06/15/2018  Clinical Social Worker Initiating Note:  Ollen Barges Date/Time: Initiated:  06/15/18/0956     Child's Name:  Mia Baker   Biological Parents:  Mother, Father(Mia Baker 10/16/1990)   Need for Interpreter:  None   Reason for Referral:  Current Substance Use/Substance Use During Pregnancy (hx drug use day before finding out she was pregnant, has not used since)   Address:  [place] [address]    Phone number:  [phone] (home)     Additional phone number:   Household Members/Support Persons (HM/SP):   Household Member/Support Person 1   HM/SP Name Relationship DOB or Age  HM/SP -1 Mia Baker Husband 10/16/1988  HM/SP -2        HM/SP -3        HM/SP -4        HM/SP -5        HM/SP -6        HM/SP -7        HM/SP -8          Natural Supports (not living in the home):  Extended Family, Immediate Family, Friends, Spouse/significant other   Professional Supports: Therapist([facility])   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   Education:  [place] arranged:    Museum/gallery curator Resources:  Information systems manager (Theme park manager)   Other Resources:      Cultural/Religious Considerations Which May Impact Care:    Strengths:  Ability to meet basic needs , Home prepared for child , Pediatrician chosen   Psychotropic Medications:         Pediatrician:    [facility] area  Pediatrician List:   [place]      Pediatrician Fax Number:    Risk Factors/Current Problems:  Substance Use (Has not used since finding out she was pregnant)   Cognitive State:  Able to Concentrate , Alert , Goal  Oriented , Insightful    Mood/Affect:  Calm , Comfortable , Interested , Relaxed    CSW Assessment: CSW received consult for hx of drug use.  CSW met with MOB to offer support and complete assessment.    MOB sitting up in chair holding infant when CSW entered the room. CSW introduced self, role and reason for consult. MOB expressed understanding and was open and engaged throughout assessment. 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 MOB's highest level of education is a Bachelor's degree and denied receiving WIC or Food Stamps.   CSW inquired about MOB's mental health history to which MOB stated she had anxiety back in 2013 and 2016 but attributes those times to stressful life events. MOB stated she was prescribed Lexapro at one time but does not currently take nor does she feel like she needs it. MOB also reported she was prescribed Xanax PRN but discontinued it once she found out she was pregnant. MOB stated she does not feel she needs medication at this time but is comfortable reaching out if she needs them. MOB informed CSW that she started seeing a therapist through [place] when she found out she was pregnant and  intends to schedule an appointment once she returns home. MOB denied any current mental health symptoms and denied any SI/HI or DV in the home. MOB mentioned DV was listed in her [facility] but stated she did not know why as she has never been in a DV relationship. MOB reported a "great" relationship with husband. CSW provided education regarding the baby blues period vs. perinatal mood disorders, discussed treatment and gave resources for mental health follow up if concerns arise.  CSW recommends self-evaluation during the postpartum time period using the New Mom Checklist from Postpartum Progress and encouraged MOB to contact a medical professional if symptoms are noted at any time. MOB requested CSW provide same  information to FOB when he returns to the room. FOB open and interested in information provided.   CSW inquired about where infant would be sleeping when she gets home. MOB stated infant will start out in a basinet in the room and then transition to a crib. CSW provided review of Sudden Infant Death Syndrome (SIDS) precautions and Safe Sleeping Habits. MOB engaged and interested throughout discussion.   CSW inquired about MOB's substance use history. MOB stated she did not use substances once she found out she was pregnant. Per MOB, she used cocaine the day prior to finding out she was pregnant and stated she was upset that she did that. MOB reported she has not used substances since. CSW informed MOB of [place] and stated UDS and CDS were still pending, but that a CPS report would be made if warranted. MOB expressed understanding and had no concerns for results.    MOB denied any further concerns or questions at this time. CSW identifies no further need for intervention and no barriers to discharge at this time.   CSW Plan/Description:  No Further Intervention Required/No Barriers to Discharge, Sudden Infant Death Syndrome (SIDS) Education, Perinatal Mood and Anxiety Disorder (PMADs) Education, [place], CSW Will Continue to Monitor Umbilical Cord Tissue Drug Screen Results and Make Report if Warranted    Ollen Barges, [place] 06/15/2018, 10:39 AM

## 2018-06-15 NOTE — Anesthesia Postprocedure Evaluation (Signed)
Anesthesia Post Note  Patient: Mia Baker  Procedure(s) Performed: CESAREAN SECTION (N/A )     Patient location during evaluation: Mother Baby Anesthesia Type: Epidural Level of consciousness: awake Pain management: pain level controlled Vital Signs Assessment: post-procedure vital signs reviewed and stable Respiratory status: spontaneous breathing Cardiovascular status: stable Postop Assessment: patient able to bend at knees and epidural receding Anesthetic complications: no    Last Vitals:  Vitals:   06/15/18 0340 06/15/18 0735  BP: 105/62 107/62  Pulse: 71 85  Resp: 18 18  Temp: (!) 36.2 C 36.6 C  SpO2:  100%    Last Pain:  Vitals:   06/15/18 0735  TempSrc: Oral  PainSc: 3    Pain Goal: Patients Stated Pain Goal: 4 (06/15/18 0735)              Epidural/Spinal Function Cutaneous sensation: Normal sensation (06/15/18 0735), Patient able to flex knees: Yes (06/15/18 0735), Patient able to lift hips off bed: Yes (06/15/18 0735), Back pain beyond tenderness at insertion site: No (06/15/18 0735), Progressively worsening motor and/or sensory loss: No (06/15/18 0735), Bowel and/or bladder incontinence post epidural: No (06/15/18 0735)  Edison Pace

## 2018-06-15 NOTE — Progress Notes (Signed)
Subjective: POD# 1 Live born female  Birth Weight: 7 lb 1.1 oz (3205 g) APGAR: 8, 9  Newborn Delivery   Birth date/time:  06/14/2018 17:42:00 Delivery type:  C-Section, Low Transverse Trial of labor:  Yes C-section categorization:  Primary    Baby name: Rheya Delivering provider: Olivia Mackie    Feeding: breast  Pain control at delivery: Epidural   Reports feeling well.  Patient reports tolerating PO.   Breast symptoms: latching w/ ease. Pain controlled with PO meds Denies HA/SOB/C/P/N/V/dizziness. Flatus present last night, none this AM. She reports vaginal bleeding as normal, without clots.  She is ambulating, no voids yet, foley cath [facility] in past hour.  Objective:   VS:    Vitals:   06/14/18 2235 06/14/18 2339 06/15/18 0340 06/15/18 0735  BP: 111/69 122/78 105/62 107/62  Pulse: 95 85 71 85  Resp: 20 18 18 18   Temp: 98 F (36.7 C) 98.7 F (37.1 C) (!) 97.2 F (36.2 C) 97.9 F (36.6 C)  TempSrc: Oral Oral Oral Oral  SpO2: 98% 98%  100%  Weight:      Height:          Intake/Output Summary (Last 24 hours) at 06/15/2018 1034 Last data filed at 06/15/2018 0735 Gross per 24 hour  Intake 3892 ml  Output 6600 ml  Net -2708 ml        Recent Labs    06/14/18 0058 06/15/18 0620  WBC 13.5* 23.1*  HGB 13.2 9.2*  HCT 39.9 27.0*  PLT 203 148*     Blood type: --/--/B POS, B POS Performed at [facility], [address], [place], [place] [address]  [phone])  Rubella: Immune (08/13 0000)  Vaccines: TDaP UTD         Flu    UTD   Physical Exam:  General: alert, cooperative and no distress CV: Regular rate and rhythm Resp: clear Abdomen: soft NT, hypoactive BS Incision: clean, dry, intact and minimal drainage Uterine Fundus: firm, below umbilicus, nontender Lochia: minimal Ext: no edema, redness or tenderness in the calves or thighs   Assessment/Plan: 28 y.o.   POD# 1. G1P1001                  Principal Problem:   Postpartum care  following cesarean delivery (3/9) Active Problems:   Maternal anemia, with delivery  - started oral Fe and Mag ox   1C/S - failure to progress: arrest of dilation and non-reassuring fetal status Maternal fever in labor, afebrile since delivery  - cefotetan 2 gm q 12 hrs x 24 hrs afebrile  Doing well, stable.               Advance diet as tolerated Encourage rest when baby rests Breastfeeding support Encourage to ambulate, warm fluids to increase gut motility Routine post-op care  Neta Mends, CNM, MSN 06/15/2018, 10:34 AM

## 2018-06-15 NOTE — Addendum Note (Signed)
Addendum  created 06/15/18 0824 by Earmon Phoenix, CRNA   Charge Capture section accepted, Clinical Note Signed

## 2018-06-15 NOTE — Lactation Note (Signed)
This note was copied from a baby's chart. Lactation Consultation Note  Patient Name: Girl Narcissa Kimery DPOEU'M Date: 06/15/2018 Reason for consult: Follow-up assessment;Mother's request;1st time breastfeeding;Term  P1 mother whose infant is now 28 hours old.    Mother was resting in chair and baby was STS with father showing feeding cues when I arrived.  Offered to assist with latching and mother accepted.  Mother desired to sit in chair for feeding and I suggested the cross cradle hold.  Mother was able to hand express colostrum drops which I finger fed to baby.  Assisted to latch in the cross cradle hold on the left breast without difficulty.  Initially mother sensitive but after she relaxed and baby achieved a nice rhythmic suck mother denied pain.  Demonstrated how father can also do a gentle chin tug if latching is not comfortable for mother.  Baby very actively sucked for 16 minutes while I educated parents about breast feeding basics.  Demonstrated breast compressions and mother able to return demonstrate.    Mother was very pleased to see baby feeding well.  Encouraged to continue watching for feeding cues and to feed 8-12 times/24 hours or sooner if baby shows cues.  She will do hand expression before/after latching to help increase milk supply.  Mother will call for latch assistance as needed.  Father present.   Maternal Data Formula Feeding for Exclusion: No Has patient been taught Hand Expression?: Yes Does the patient have breastfeeding experience prior to this delivery?: No  Feeding Feeding Type: Breast Fed  LATCH Score Latch: Grasps breast easily, tongue down, lips flanged, rhythmical sucking.  Audible Swallowing: Spontaneous and intermittent  Type of Nipple: Everted at rest and after stimulation  Comfort (Breast/Nipple): Soft / non-tender  Hold (Positioning): Assistance needed to correctly position infant at breast and maintain latch.  LATCH Score:  9  Interventions Interventions: Breast feeding basics reviewed;Assisted with latch;Skin to skin;Breast massage;Hand express;Breast compression;Position options;Support pillows;Adjust position  Lactation Tools Discussed/Used     Consult Status Consult Status: Follow-up Date: 06/16/18 Follow-up type: In-patient    Donshay Lupinski R Gabriel Conry 06/15/2018, 4:16 PM

## 2018-06-16 NOTE — Progress Notes (Signed)
POSTOPERATIVE DAY # 2 S/P Primary LTCS - failure to progress: arrest of dilation and non-reassuring fetal status, baby girl "Rheya"   S:         Reports feeling more sore today with movement, but overall well             Tolerating po intake / no nausea / no vomiting / + flatus / no BM  Denies dizziness, SOB, or CP             Bleeding is light             Pain controlled with Motrin and Percocet             Up ad lib / ambulatory - reports taking 3 walks in the hallway daily/ voiding QS  Newborn breast feeding - reports it is going well; latching well                        O:  VS: BP 107/70 (BP Location: Right Arm)   Pulse 73   Temp 98.2 F (36.8 C) (Oral)   Resp 18   Ht 5\' 7"  (1.702 m)   Wt 77.1 kg   SpO2 98%   Breastfeeding Unknown   BMI 26.63 kg/m    LABS:               Recent Labs    06/14/18 0058 06/15/18 0620  WBC 13.5* 23.1*  HGB 13.2 9.2*  PLT 203 148*               Bloodtype: --/--/B POS, B POS Performed at [facility], [address], [place], [place] [address]  [phone])  Rubella: Immune (08/13 0000)                                             I&O: Intake/Output      03/10 0701 - 03/11 0700 03/11 0701 - 03/12 0700   P.O. 960    I.V. (mL/kg) 250 (3.2)    Total Intake(mL/kg) 1210 (15.7)    Urine (mL/kg/hr) 2200 (1.2)    Blood     Total Output 2200    Net -990                      Physical Exam:             Alert and Oriented X3  Lungs: Clear and unlabored  Heart: regular rate and rhythm / no murmurs  Abdomen: soft, non-tender, non-distended, active bowel sounds in all quadrants             Fundus: firm, non-tender, U-2             Dressing: honeycomb with steri-strips - old drainage noted; otherwise dry/intact              Incision:  approximated with sutures / no erythema / no ecchymosis / no drainage  Perineum: intact  Lochia: small, no clots   Extremities: trace LE edema, no calf pain or tenderness,   A/P:    POD # 2 S/P Primary  LTCS            Maternal anemia, with delivery              - started oral Fe and Mag ox  Maternal  fever in labor, afebrile since delivery              - cefotetan 2 gm q 12 hrs x 24 hrs afebrile   Routine postoperative care              May shower today   Encouraged to rest when baby rests  Anticipate d/c home tomorrow   Carlean Jews, MSN, CNM [facility]

## 2018-06-17 MED ORDER — OXYCODONE-ACETAMINOPHEN 5-325 MG PO TABS: 1.0000 | ORAL_TABLET | Freq: Four times a day (QID) | ORAL | 0 refills | Status: DC | PRN

## 2018-06-17 MED ORDER — MAGNESIUM OXIDE 400 (241.3 MG) MG PO TABS: 400.0000 mg | ORAL_TABLET | Freq: Every day | ORAL | 0 refills | Status: DC

## 2018-06-17 MED ORDER — POLYSACCHARIDE IRON COMPLEX 150 MG PO CAPS: 150.0000 mg | ORAL_CAPSULE | Freq: Every day | ORAL | 0 refills | Status: DC

## 2018-06-17 MED ORDER — IBUPROFEN 800 MG PO TABS: 800.0000 mg | ORAL_TABLET | Freq: Four times a day (QID) | ORAL | 0 refills | Status: DC

## 2018-06-17 NOTE — Discharge Summary (Signed)
OB Discharge Summary  Patient Name: Mia Baker DOB: Aug 15, 1990 MRN: [id number]  Date of admission: 06/13/2018  Admitting diagnosis: 40 WKS, WATER BROKE Intrauterine pregnancy: [redacted]w[redacted]d      Date of discharge: 06/17/2018    Discharge diagnosis: Term Pregnancy Delivered, Anemia and POD 3 s/p CS for [facility] and arrest of active phase of labor      Prenatal history: G1P1001   EDC : 06/11/2018, by Other Basis  Prenatal care at [facility]  Primary provider : [place] Prenatal course uncomplicated  Prenatal Labs: ABO, Rh: --/--/B POS, B POS Performed at [facility], [address] [place], [place] [address]  [phone])  Antibody: NEG (03/09 0058) Rubella: Immune (08/13 0000)   RPR: Non Reactive (03/09 0058)  HBsAg: Negative (08/13 0000)  HIV: Non-reactive (08/13 0000)  GBS: Negative (01/31 0000)                                    Hospital course:  Onset of Labor With Unplanned C/S  28 y.o. yo G1P1001 at [redacted]w[redacted]d was admitted in Active Labor on 06/13/2018. Patient had a labor course significant for fetal tachycardia (200bpm) and arrested dilation at 8cm despite adequate ctx per IUPC. Membrane Rupture Time/Date: 11:45 PM ,06/13/2018  The patient went for cesarean section due to Arrest of Dilation and Non-Reassuring FHR, and delivered a Viable infant,06/14/2018  Details of operation can be found in separate operative note. Patient had an uncomplicated postpartum course.  She is ambulating,tolerating a regular diet, passing flatus, and urinating well.  Patient is discharged home in stable condition 06/17/18.  Delivering PROVIDER: Olivia Mackie                                                            Complications: None  Newborn Data: Live born female  Birth Weight: 7 lb 1.1 oz (3205 g) APGAR: 8, 9  Newborn Delivery   Birth date/time:  06/14/2018 17:42:00 Delivery type:  C-Section, Low Transverse Trial of labor:  Yes C-section categorization:   Primary     Baby Feeding: Breast Disposition:home with mother  Post partum procedures:none  Labs: Lab Results  Component Value Date   WBC 23.1 (H) 06/15/2018   HGB 9.2 (L) 06/15/2018   HCT 27.0 (L) 06/15/2018   MCV 93.4 06/15/2018   PLT 148 (L) 06/15/2018   CMP Latest Ref Rng & Units 01/21/2015  Glucose 65 - 99 mg/dL 97  BUN 6 - 20 mg/dL 6  Creatinine 2.33 - 0.07 mg/dL 6.22  Sodium 633 - 354 mmol/L 139  Potassium 3.5 - 5.1 mmol/L 4.8  Chloride 101 - 111 mmol/L 105  CO2 22 - 32 mmol/L 29  Calcium 8.9 - 10.3 mg/dL 9.0  Total Protein 6.5 - 8.1 g/dL -  Total Bilirubin 0.3 - 1.2 mg/dL -  Alkaline Phos 38 - 562 U/L -  AST 15 - 41 U/L -  ALT 14 - 54 U/L -      Physical Exam @ time of discharge:  Vitals:   06/15/18 2246 06/16/18 0531 06/16/18 2206 06/17/18 0616  BP: 110/60 107/70 108/62 115/80  Pulse: 87 73 84 85  Resp: 17 18 16  16  Temp: 97.8 F (36.6 C) 98.2 F (36.8 C) 98.5 F (36.9 C) 98.4 F (36.9 C)  TempSrc: Oral Oral Oral Oral  SpO2: 98%  99%   Weight:      Height:        General: alert, cooperative and no distress Lochia: appropriate Uterine Fundus: firm Perineum: intact Incision: Healing well with no significant drainage Extremities: DVT Evaluation: No evidence of DVT seen on physical exam.   Discharge instructions:  "Baby and Me Booklet" and [facility] Booklet  Discharge Medications:  Allergies as of 06/17/2018      Reactions   Penicillins Other (See Comments)   Tolerated Zosyn Has patient had a PCN reaction causing immediate rash, facial/tongue/throat swelling, SOB or lightheadedness with hypotension: No Has patient had a PCN reaction causing severe rash involving mucus membranes or skin necrosis: No Has patient had a PCN reaction that required hospitalization No Has patient had a PCN reaction occurring within the last 10 years: No If all of the above answers are "NO", then may proceed with Cephalosporin use.Unsure of reaction, was as a child       Medication List    TAKE these medications   ibuprofen 800 MG tablet Commonly known as:  ADVIL,MOTRIN Take 1 tablet (800 mg total) by mouth every 6 (six) hours.   iron polysaccharides 150 MG capsule Commonly known as:  NIFEREX Take 1 capsule (150 mg total) by mouth daily.   magnesium oxide 400 (241.3 Mg) MG tablet Commonly known as:  MAG-OX Take 1 tablet (400 mg total) by mouth daily.   oxyCODONE-acetaminophen 5-325 MG tablet Commonly known as:  PERCOCET/ROXICET Take 1 tablet by mouth every 6 (six) hours as needed for moderate pain.   prenatal multivitamin Tabs tablet Take 1 tablet by mouth daily at 12 noon.            Discharge Care Instructions  (From admission, onward)         Start     Ordered   06/17/18 0000  Discharge wound care:    Comments:  Leave honeycomb in place for 5 days - remove if get wet in shower. Leave steri-strips in place x 2 weeks. Keep incision clean and dry   06/17/18 1059          Diet: routine diet  Activity: Advance as tolerated. Pelvic rest x 6 weeks.   Follow up:6 weeks    Signed: Marlinda Mike CNM, MSN, [facility] 06/17/2018, 10:59 AM

## 2018-06-17 NOTE — Progress Notes (Signed)
POSTOPERATIVE DAY # 3 S/P CS - Arrest of active labor with NRFHR  S:         Reports feeling well             Tolerating po intake / no nausea / no vomiting / + flatus / no BM             Bleeding is light             Pain controlled with Motrin and oxycodone             Up ad lib / ambulatory/ voiding QS  Newborn Breast   O:  VS: BP 115/80 (BP Location: Right Arm)   Pulse 85   Temp 98.4 F (36.9 C) (Oral)   Resp 16   Ht 5\' 7"  (1.702 m)   Wt 77.1 kg   SpO2 99%   Breastfeeding Unknown   BMI 26.63 kg/m    LABS:              Recent Labs    06/15/18 0620  WBC 23.1*  HGB 9.2*  PLT 148*               Bloodtype: --/--/B POS, B POS Performed at [facility], [address] [place], [place] [address]  [phone])  Rubella: Immune (08/13 0000)                                Physical Exam:             Alert and Oriented X3  Lungs: Clear and unlabored  Heart: regular rate and rhythm / no mumurs  Abdomen: soft, non-tender, non-distended, active hypoactive BS             Fundus: firm, non-tender, Ueven             Dressing intact with marked drainage              Incision:  approximated with suture / no erythema / no ecchymosis / no active drainage  Perineum: intact  Lochia: light  Extremities: trace edema, no calf pain or tenderness  A:        POD # 3 S/P CS            ABL anemia  P:        Routine postoperative care              Change dressing prior to DC home (>1/2 dried drainage)              Routine postop care instructions - WOB booklet              Iron and magnesium with PNV   Marlinda Mike CNM, MSN, [facility] 06/17/2018, 10:52 AM

## 2018-06-17 NOTE — Lactation Note (Signed)
This note was copied from a baby's chart. Lactation Consultation Note  Patient Name: Mia Baker'T Date: 06/17/2018 Reason for consult: Follow-up assessment;Infant weight loss;Primapara;1st time breastfeeding;Term;Other (Comment)(milk coming in and breast are full )  Baby is 38 hours old  Awake and hungry  LC assisted mom and worked on depth - see below.  Mom mentioned soreness is better.  Sore nipple and engorgement and prevention and tx reviewed.  Lc instructed mom on the use hand pump and shells.  Due to short shaft nipple recommended prior to latch - breast massage / hand express/ pre- pump prior to  Latch to make the nipple /areola more elastic.  Mother informed of post-discharge support and given phone number to the lactation department, including services for phone call assistance; out-patient appointments; and breastfeeding support group. List of other breastfeeding resources in the community given in the handout. Encouraged mother to call for problems or concerns related to breastfeeding.   Maternal Data Has patient been taught Hand Expression?: Yes Does the patient have breastfeeding experience prior to this delivery?: No  Feeding Feeding Type: Breast Fed  LATCH Score Latch: Grasps breast easily, tongue down, lips flanged, rhythmical sucking.  Audible Swallowing: Spontaneous and intermittent  Type of Nipple: Everted at rest and after stimulation  Comfort (Breast/Nipple): Filling, red/small blisters or bruises, mild/mod discomfort  Hold (Positioning): Assistance needed to correctly position infant at breast and maintain latch.  LATCH Score: 8  Interventions Interventions: Breast feeding basics reviewed;Assisted with latch;Skin to skin;Breast massage;Adjust position;Support pillows;Breast compression;Position options;Shells;Hand pump  Lactation Tools Discussed/Used Tools: Shells;Pump Shell Type: Inverted Breast pump type: Manual WIC Program: No Pump  Review: Setup, frequency, and cleaning;Milk Storage Initiated by:: MAI  Date initiated:: 06/17/18   Consult Status Consult Status: Complete Date: 06/17/18 Follow-up type: In-patient    Mia Baker 06/17/2018, 10:24 AM

## 2020-05-03 ENCOUNTER — Ambulatory Visit (INDEPENDENT_AMBULATORY_CARE_PROVIDER_SITE_OTHER): Payer: No Typology Code available for payment source | Admitting: Sports Medicine

## 2020-05-03 ENCOUNTER — Other Ambulatory Visit: Payer: Self-pay

## 2020-05-03 DIAGNOSIS — M21622 Bunionette of left foot: Secondary | ICD-10-CM

## 2020-05-03 DIAGNOSIS — M21619 Bunion of unspecified foot: Secondary | ICD-10-CM

## 2020-05-03 DIAGNOSIS — M79674 Pain in right toe(s): Secondary | ICD-10-CM | POA: Diagnosis not present

## 2020-05-03 DIAGNOSIS — M2042 Other hammer toe(s) (acquired), left foot: Secondary | ICD-10-CM

## 2020-05-03 DIAGNOSIS — M79675 Pain in left toe(s): Secondary | ICD-10-CM

## 2020-05-03 DIAGNOSIS — L84 Corns and callosities: Secondary | ICD-10-CM | POA: Diagnosis not present

## 2020-05-03 DIAGNOSIS — M2041 Other hammer toe(s) (acquired), right foot: Secondary | ICD-10-CM

## 2020-05-03 DIAGNOSIS — M21621 Bunionette of right foot: Secondary | ICD-10-CM | POA: Diagnosis not present

## 2020-05-03 NOTE — Progress Notes (Signed)
Subjective: Mia Baker is a 30 y.o. female patient who presents to office for evaluation of Left>right foot pain secondary to callus skin. Patient complains of pain at the lesion present worse at the right 4th toe. Patient has tried soaking and pedicures with no relief in symptoms. Patient denies any other pedal complaints.   Patient Active Problem List   Diagnosis Date Noted  . Postpartum care following cesarean delivery (3/9) 06/15/2018  . Maternal anemia, with delivery 06/15/2018  . 1C/S - failure to progress: arrest of dilation and non-reassuring fetal status 06/15/2018  . Acute appendicitis 02/01/2015  . Abdominal pain 01/19/2015  . Appendicitis 01/18/2015  . Visit for preventive health examination 08/09/2014  . Migraines 08/09/2014  . Anxiety state 08/09/2014    Current Outpatient Medications on File Prior to Visit  Medication Sig Dispense Refill  . ibuprofen (ADVIL,MOTRIN) 800 MG tablet Take 1 tablet (800 mg total) by mouth every 6 (six) hours. 30 tablet 0  . iron polysaccharides (NIFEREX) 150 MG capsule Take 1 capsule (150 mg total) by mouth daily. 30 capsule 0  . magnesium oxide (MAG-OX) 400 (241.3 Mg) MG tablet Take 1 tablet (400 mg total) by mouth daily. 30 tablet 0  . baclofen (LIORESAL) 20 MG tablet Take 20 mg by mouth at bedtime.    Marland Kitchen EMGALITY 120 MG/ML SOAJ Inject into the skin.    Marland Kitchen escitalopram (LEXAPRO) 20 MG tablet Take 20 mg by mouth daily.    Marland Kitchen oxyCODONE-acetaminophen (PERCOCET/ROXICET) 5-325 MG tablet Take 1 tablet by mouth every 6 (six) hours as needed for moderate pain. (Patient not taking: Reported on 05/03/2020) 15 tablet 0  . Prenatal Vit-Fe Fumarate-FA (PRENATAL MULTIVITAMIN) TABS tablet Take 1 tablet by mouth daily at 12 noon. (Patient not taking: Reported on 05/03/2020)    . rizatriptan (MAXALT) 10 MG tablet Take 10 mg by mouth 2 (two) times daily as needed.     No current facility-administered medications on file prior to visit.    Allergies   Allergen Reactions  . Penicillins Other (See Comments)    Tolerated Zosyn Has patient had a PCN reaction causing immediate rash, facial/tongue/throat swelling, SOB or lightheadedness with hypotension: No Has patient had a PCN reaction causing severe rash involving mucus membranes or skin necrosis: No Has patient had a PCN reaction that required hospitalization No Has patient had a PCN reaction occurring within the last 10 years: No If all of the above answers are "NO", then may proceed with Cephalosporin use.Unsure of reaction, was as a child    Objective:  General: Alert and oriented x3 in no acute distress  Dermatology: Keratotic lesion present left 4th toe and bilateral hallux and left 2nd toes with skin lines transversing the lesion, pain is present with direct pressure to the lesion with a central nucleated core noted, no webspace macerations, no ecchymosis bilateral, all nails x 10 are well manicured.  Vascular: Dorsalis Pedis and Posterior Tibial pedal pulses 2/4, Capillary Fill Time 3 seconds, + pedal hair growth bilateral, no edema bilateral lower extremities, Temperature gradient within normal limits.  Neurology: Michaell Cowing sensation intact via light touch bilateral.  Musculoskeletal: Mild tenderness with palpation at the keratotic lesion site on left>right + bunion and hammertoe, Muscular strength 5/5 in all groups without pain or limitation on range of motion.   Assessment and Plan: Problem List Items Addressed This Visit   None   Visit Diagnoses    Corns and callosities    -  Primary   Toe pain,  bilateral       Bunion       Tailor's bunion of both feet       Hammer toes of both feet          -Complete examination performed -Discussed treatment options -Parred keratoic lesions x4  using a chisel blade; treated the area withSalinocaine covered with bandaids -Encouraged daily skin emollients; sample of foot miracle provided -Encouraged use of pumice stone -Advised good  supportive shoes and inserts for foot type -Advised patient that if pains continue may benefit from surgery  -Patient to return to office as needed or sooner if condition worsens.  Asencion Islam, DPM

## 2020-05-08 ENCOUNTER — Telehealth: Payer: Self-pay | Admitting: *Deleted

## 2020-05-08 NOTE — Telephone Encounter (Signed)
Stop wearing the spacers until the toes heal. Thanks Dr. Marylene Land

## 2020-05-08 NOTE — Telephone Encounter (Signed)
Patient is wearing the toe spacers given and they are causing sores and pain on other areas of toes. Please advise.

## 2020-05-08 NOTE — Telephone Encounter (Signed)
Informed patient by phone, verbalized understanding and was very thankful for the insight.

## 2021-04-12 ENCOUNTER — Other Ambulatory Visit: Payer: Self-pay

## 2021-04-12 ENCOUNTER — Ambulatory Visit (INDEPENDENT_AMBULATORY_CARE_PROVIDER_SITE_OTHER): Payer: No Typology Code available for payment source

## 2021-04-12 ENCOUNTER — Encounter: Payer: Self-pay | Admitting: Podiatry

## 2021-04-12 ENCOUNTER — Ambulatory Visit (INDEPENDENT_AMBULATORY_CARE_PROVIDER_SITE_OTHER): Payer: No Typology Code available for payment source | Admitting: Podiatry

## 2021-04-12 DIAGNOSIS — M779 Enthesopathy, unspecified: Secondary | ICD-10-CM

## 2021-04-12 DIAGNOSIS — M7752 Other enthesopathy of left foot: Secondary | ICD-10-CM | POA: Diagnosis not present

## 2021-04-12 DIAGNOSIS — M79675 Pain in left toe(s): Secondary | ICD-10-CM

## 2021-04-12 MED ORDER — TRIAMCINOLONE ACETONIDE 10 MG/ML IJ SUSP
10.0000 mg | Freq: Once | INTRAMUSCULAR | Status: AC
  Administered 2021-04-12: 10 mg

## 2021-04-12 MED ORDER — DICLOFENAC SODIUM 75 MG PO TBEC: 75.0000 mg | DELAYED_RELEASE_TABLET | Freq: Two times a day (BID) | ORAL | 2 refills | Status: DC

## 2021-04-15 NOTE — Progress Notes (Signed)
Subjective:   Patient ID: Mia Baker, female   DOB: 32 y.o.   MRN: [id number]   HPI Patient presents stating she is developed a lot of pain in the big toe joint of her left foot and does not remember specific injury.  States its been this way for about a month and gradually making it harder for her to bear weight on the outside of her foot    ROS      Objective:  Physical Exam  Neurovascular status intact muscle strength adequate inflammation and pain of the left first MPJ with fluid buildup around the joint surface and pain with pressure.  There is no restriction of motion and no indication of crepitus within the joint     Assessment:  Appears to be inflammatory capsulitis of the first MPJ left foot with inflammation with possibility for functional hallux limitus     Plan:  H&P x-ray reviewed education concerning condition rendered with discussion of x-rays and I went ahead today did sterile prep and injected the first MPJ periarticular 3 mg Kenalog 5 mg Xylocaine advised on rigid bottom shoes and reappoint as symptoms indicate  X-rays indicate no signs of fracture no signs of other pathology mild elevation of the first metatarsal segment no indications of structural hallux limitus currently

## 2021-04-22 ENCOUNTER — Other Ambulatory Visit: Payer: Self-pay | Admitting: Podiatry

## 2021-04-22 DIAGNOSIS — M779 Enthesopathy, unspecified: Secondary | ICD-10-CM

## 2021-05-23 ENCOUNTER — Ambulatory Visit (INDEPENDENT_AMBULATORY_CARE_PROVIDER_SITE_OTHER): Payer: No Typology Code available for payment source | Admitting: Podiatry

## 2021-05-23 ENCOUNTER — Other Ambulatory Visit: Payer: Self-pay

## 2021-05-23 ENCOUNTER — Encounter: Payer: Self-pay | Admitting: Podiatry

## 2021-05-23 DIAGNOSIS — M205X2 Other deformities of toe(s) (acquired), left foot: Secondary | ICD-10-CM | POA: Diagnosis not present

## 2021-05-23 DIAGNOSIS — M21621 Bunionette of right foot: Secondary | ICD-10-CM | POA: Diagnosis not present

## 2021-05-23 DIAGNOSIS — I7300 Raynaud's syndrome without gangrene: Secondary | ICD-10-CM

## 2021-05-23 DIAGNOSIS — M21622 Bunionette of left foot: Secondary | ICD-10-CM

## 2021-05-26 NOTE — Progress Notes (Signed)
Subjective:   Patient ID: Mia Baker, female   DOB: 31 y.o.   MRN: [id number]   HPI Patient presents stating the joint left is feeling quite a bit better after the last treatment but these lesions on both my feet do get sore and thick and I know someday I will probably have to have them fixed   ROS      Objective:  Physical Exam  Neurovascular status intact with inflammation of the first MPJ left that is improved with minimal discomfort good range of motion no crepitus with severe lesion formations of the fifth metatarsal head bilateral with keratotic tissue formation     Assessment:  Improving functional hallux limitus with inflammatory capsulitis first MPJ left with lesion formation fifth metatarsal bilateral still present     Plan:  H&P reviewed conditions and did deep debridement of lesions and explained that ultimately some form of osteotomy will probably be necessary.  These are difficult problems with no long-term guarantees and patient understands this wants to try trimming and how long it gets her better for and then decide on long-term plan.  I also for the big toe joint advised on rigid bottom shoes and patient will be seen back to reevaluate as needed

## 2021-12-18 ENCOUNTER — Ambulatory Visit (INDEPENDENT_AMBULATORY_CARE_PROVIDER_SITE_OTHER): Payer: No Typology Code available for payment source | Admitting: Physician Assistant

## 2021-12-18 ENCOUNTER — Encounter: Payer: Self-pay | Admitting: Physician Assistant

## 2021-12-18 VITALS — BP 110/74 | HR 54 | Temp 98.7°F | Ht 67.0 in | Wt 148.4 lb

## 2021-12-18 DIAGNOSIS — F419 Anxiety disorder, unspecified: Secondary | ICD-10-CM | POA: Diagnosis not present

## 2021-12-18 DIAGNOSIS — Z23 Encounter for immunization: Secondary | ICD-10-CM

## 2021-12-18 DIAGNOSIS — G43009 Migraine without aura, not intractable, without status migrainosus: Secondary | ICD-10-CM

## 2021-12-18 NOTE — Progress Notes (Signed)
Subjective:    Patient ID: Mia Baker, female    DOB: October 03, 1990, 31 y.o.   MRN: [id number]  Chief Complaint  Patient presents with   New Patient (Initial Visit)    HPI 31 y.o. patient presents today for new patient establishment with me.  Patient has not had PCP in awhile. Married. Three year old, Mia Baker, at home.  Overall states that she is pretty healthy.  Current Care Team: GYN   Neurology - once every 6 months sees Dr. Val Baker for migraine management Psychiatry - once every 6 months for anxiety medication management   Acute Concerns: Prior hx of migraines - sees neurology - stress tends to bring them on.  She has been stable for the last year and a half and has them much less frequently.  She takes gabapentin 800 mg daily and this seems to keep things controlled.  She is hoping that I can take over medication management.  Anxiety is overall stable.  She takes Lexapro 20 mg once daily and propranolol 10 mg twice daily.  She likes to work out and control stress levels naturally.  IUD out last month - trying to conceive with husband currently.   Past Medical History:  Diagnosis Date   1C/S - failure to progress: arrest of dilation and non-reassuring fetal status 06/15/2018   Allergy    Appendicitis 01/18/2015   Maternal anemia, with delivery 06/15/2018   Migraines    Postpartum care following cesarean delivery (3/9) 06/15/2018    Past Surgical History:  Procedure Laterality Date   APPENDECTOMY     CESAREAN SECTION N/A 06/14/2018   Procedure: CESAREAN SECTION;  Surgeon: Olivia Mackie, MD;  Location: [facility] ORS;  Service: Obstetrics;  Laterality: N/A;   LAPAROSCOPIC APPENDECTOMY N/A 01/19/2015   Procedure: APPENDECTOMY LAPAROSCOPIC;  Surgeon: Darnell Level, MD;  Location: [facility] ORS;  Service: General;  Laterality: N/A;   WISDOM TOOTH EXTRACTION      Family History  Problem Relation Age of Onset   Anemia Mother    Hypertension Father    Cancer Maternal Grandmother         unsure of type    Social History   Tobacco Use   Smoking status: Former   Smokeless tobacco: Never  Substance Use Topics   Alcohol use: Yes    Alcohol/week: 5.0 standard drinks of alcohol    Types: 5 Standard drinks or equivalent per week   Drug use: Yes    Comment: marijuana - rare     Allergies  Allergen Reactions   Penicillins Other (See Comments)    Tolerated Zosyn Has patient had a PCN reaction causing immediate rash, facial/tongue/throat swelling, SOB or lightheadedness with hypotension: No Has patient had a PCN reaction causing severe rash involving mucus membranes or skin necrosis: No Has patient had a PCN reaction that required hospitalization No Has patient had a PCN reaction occurring within the last 10 years: No If all of the above answers are "NO", then may proceed with Cephalosporin use.Unsure of reaction, was as a child    Review of Systems NEGATIVE UNLESS OTHERWISE INDICATED IN HPI      Objective:     BP 110/74   Pulse (!) 54   Temp 98.7 F (37.1 C)   Ht 5\' 7"  (1.702 m)   Wt 148 lb 6.4 oz (67.3 kg)   SpO2 98%   BMI 23.24 kg/m   Wt Readings from Last 3 Encounters:  12/18/21 148 lb 6.4 oz (67.3 kg)  06/14/18 170 lb (77.1 kg)  04/16/16 126 lb (57.2 kg)    BP Readings from Last 3 Encounters:  12/18/21 110/74  06/17/18 115/80  04/16/16 128/100     Physical Exam Vitals and nursing note reviewed.  Constitutional:      Appearance: Normal appearance. She is normal weight. She is not toxic-appearing.  HENT:     Head: Normocephalic and atraumatic.  Eyes:     Extraocular Movements: Extraocular movements intact.     Conjunctiva/sclera: Conjunctivae normal.     Pupils: Pupils are equal, round, and reactive to light.  Cardiovascular:     Rate and Rhythm: Normal rate and regular rhythm.     Pulses: Normal pulses.     Heart sounds: Normal heart sounds.  Pulmonary:     Effort: Pulmonary effort is normal.     Breath sounds: Normal breath sounds.   Musculoskeletal:     Cervical back: Normal range of motion and neck supple.  Skin:    General: Skin is warm and dry.  Neurological:     General: No focal deficit present.     Mental Status: She is alert and oriented to person, place, and time.  Psychiatric:        Mood and Affect: Mood normal.        Behavior: Behavior normal.        Assessment & Plan:  Anxiety Assessment & Plan: Patient has been stable.  She has been following with psychiatry.  I plan to take over medication management.  She will continue on Lexapro 20 mg qd and propranolol 10 mg twice daily.  I have sent a referral to see psychology Mia Baker at our office as she does enjoy going to counseling.  She is doing a good job with natural remedies keeping her stress levels low.  Orders: -     Ambulatory referral to Psychology  Migraine without aura and without status migrainosus, not intractable Assessment & Plan: Patient has been followed with neurology.  She currently takes gabapentin 800 mg daily and is stable with this medication.  I plan to take over refilling prescriptions.  She can always follow-up with neurology in the future if anything changes.  She knows that stress brings her migraines on and she does her best to try to keep her stress levels low.   Need for immunization against influenza -     Flu Vaccine QUAD 33mo+IM (Fluarix, Fluzone & Alfiuria Quad PF)      Return for CPE, fasting labs .  This note was prepared with assistance of Conservation officer, historic buildings. Occasional wrong-word or sound-a-like substitutions may have occurred due to the inherent limitations of voice recognition software.     Hoorain Kozakiewicz M Kolt Mcwhirter, PA-C

## 2021-12-18 NOTE — Patient Instructions (Addendum)
Welcome to [facility]! It was a pleasure meeting you today.  As discussed, Please schedule a physical with fasting labs before the end of the year.  Call for refills of medications - happy to take over management  PLEASE NOTE:  If you had any LAB tests please let us know if you have not heard back within a few days. You may see your results on MyChart before we have a chance to review them but we will give you a call once they are reviewed by [place]. If we ordered any REFERRALS today, please let us know if you have not heard from their office within the next two weeks. Let us know through MyChart if you are needing REFILLS, or have your pharmacy send [place] the request. You can also use MyChart to communicate with me or any office staff.  Please try these tips to maintain a healthy lifestyle:  Eat most of your calories during the day when you are active. Eliminate processed foods including packaged sweets (pies, cakes, cookies), reduce intake of potatoes, white bread, white pasta, and white rice. Look for whole grain options, oat flour or almond flour.  Each meal should contain half fruits/vegetables, one quarter protein, and one quarter carbs (no bigger than a computer mouse).  Cut down on sweet beverages. This includes juice, soda, and sweet tea. Also watch fruit intake, though this is a healthier sweet option, it still contains natural sugar! Limit to 3 servings daily.  Drink at least 1 glass of water with each meal and aim for at least 8 glasses (64 ounces) per day.  Exercise at least 150 minutes every week to the best of your ability.    Take Care,  Ila Mcgill, PA-C       [facility]-Patients can call [phone] to schedule.

## 2021-12-18 NOTE — Assessment & Plan Note (Signed)
Patient has been stable.  She has been following with psychiatry.  I plan to take over medication management.  She will continue on Lexapro 20 mg qd and propranolol 10 mg twice daily.  I have sent a referral to see psychology Dr. Sallyanne Kuster at our office as she does enjoy going to counseling.  She is doing a good job with natural remedies keeping her stress levels low.

## 2021-12-18 NOTE — Assessment & Plan Note (Signed)
Patient has been followed with neurology.  She currently takes gabapentin 800 mg daily and is stable with this medication.  I plan to take over refilling prescriptions.  She can always follow-up with neurology in the future if anything changes.  She knows that stress brings her migraines on and she does her best to try to keep her stress levels low.

## 2022-01-07 ENCOUNTER — Encounter: Payer: No Typology Code available for payment source | Admitting: Physician Assistant

## 2022-01-08 ENCOUNTER — Encounter: Payer: Self-pay | Admitting: Physician Assistant

## 2022-01-08 ENCOUNTER — Ambulatory Visit (INDEPENDENT_AMBULATORY_CARE_PROVIDER_SITE_OTHER): Payer: No Typology Code available for payment source | Admitting: Physician Assistant

## 2022-01-08 VITALS — BP 134/84 | HR 70 | Temp 97.8°F | Ht 67.0 in | Wt 144.8 lb

## 2022-01-08 DIAGNOSIS — Z13220 Encounter for screening for lipoid disorders: Secondary | ICD-10-CM | POA: Diagnosis not present

## 2022-01-08 DIAGNOSIS — Z131 Encounter for screening for diabetes mellitus: Secondary | ICD-10-CM | POA: Diagnosis not present

## 2022-01-08 DIAGNOSIS — Z0000 Encounter for general adult medical examination without abnormal findings: Secondary | ICD-10-CM

## 2022-01-08 DIAGNOSIS — R051 Acute cough: Secondary | ICD-10-CM | POA: Diagnosis not present

## 2022-01-08 MED ORDER — BENZONATATE 100 MG PO CAPS: 100.0000 mg | ORAL_CAPSULE | Freq: Three times a day (TID) | ORAL | 0 refills | Status: DC | PRN

## 2022-01-08 NOTE — Patient Instructions (Addendum)
Great to see you today! Keep up good work.  Tessalon perles for cough. Let me know if worse or not improving.   Schedule for fasting labs in the next few weeks. Next CPE in one year

## 2022-01-08 NOTE — Progress Notes (Signed)
Subjective:    Patient ID: Mia Baker, female    DOB: 11/07/1990, 31 y.o.   MRN: [id number]  Chief Complaint  Patient presents with   Annual Exam    Pt in for annual exam and fasting labs; pt is not fasting and ok with scheduling a future lab appt to come back one morning fasting; bad cough for past 2 weeks, tested negative for Covid, productive cough, yellow mucus; has tried Mucinex OTC with no relief.     HPI Patient is in today for annual exam.  Acute concerns: Wet, productive thick cough x 2 weeks  Health maintenance: Lifestyle/ exercise: Stays active Nutrition: Doing well Mental health: Doing well Substance use: None ETOH: Occasional Sexual activity: Monogamous  Immunizations: UTD  Colonoscopy: start at age 15  Pap: UTD with GYN Mammogram: age 55    Past Medical History:  Diagnosis Date   1C/S - failure to progress: arrest of dilation and non-reassuring fetal status 06/15/2018   Allergy    Appendicitis 01/18/2015   Maternal anemia, with delivery 06/15/2018   Migraines    Postpartum care following cesarean delivery (3/9) 06/15/2018    Past Surgical History:  Procedure Laterality Date   APPENDECTOMY     CESAREAN SECTION N/A 06/14/2018   Procedure: CESAREAN SECTION;  Surgeon: Brien Few, MD;  Location: [facility];  Service: Obstetrics;  Laterality: N/A;   LAPAROSCOPIC APPENDECTOMY N/A 01/19/2015   Procedure: APPENDECTOMY LAPAROSCOPIC;  Surgeon: Armandina Gemma, MD;  Location: [facility];  Service: General;  Laterality: N/A;   WISDOM TOOTH EXTRACTION      Family History  Problem Relation Age of Onset   Anemia Mother    Hypertension Father    Cancer Maternal Grandmother        unsure of type    Social History   Tobacco Use   Smoking status: Former   Smokeless tobacco: Never  Substance Use Topics   Alcohol use: Yes    Alcohol/week: 5.0 standard drinks of alcohol    Types: 5 Standard drinks or equivalent per week   Drug use: Yes    Comment:  marijuana - rare     Allergies  Allergen Reactions   Penicillins Other (See Comments)    Tolerated Zosyn Has patient had a PCN reaction causing immediate rash, facial/tongue/throat swelling, SOB or lightheadedness with hypotension: No Has patient had a PCN reaction causing severe rash involving mucus membranes or skin necrosis: No Has patient had a PCN reaction that required hospitalization No Has patient had a PCN reaction occurring within the last 10 years: No If all of the above answers are "NO", then may proceed with Cephalosporin use.Unsure of reaction, was as a child    Review of Systems NEGATIVE UNLESS OTHERWISE INDICATED IN HPI      Objective:     BP 134/84 (BP Location: Left Arm)   Pulse 70   Temp 97.8 F (36.6 C) (Temporal)   Ht 5\' 7"  (1.702 m)   Wt 144 lb 12.8 oz (65.7 kg)   LMP  (LMP Unknown) Comment: trying to conceive  SpO2 99%   BMI 22.68 kg/m   Wt Readings from Last 3 Encounters:  01/08/22 144 lb 12.8 oz (65.7 kg)  12/18/21 148 lb 6.4 oz (67.3 kg)  06/14/18 170 lb (77.1 kg)    BP Readings from Last 3 Encounters:  01/08/22 134/84  12/18/21 110/74  06/17/18 115/80     Physical Exam Vitals and nursing note reviewed.  Constitutional:  Appearance: Normal appearance. She is normal weight. She is not toxic-appearing.  HENT:     Head: Normocephalic and atraumatic.     Right Ear: Tympanic membrane, ear canal and external ear normal.     Left Ear: Tympanic membrane, ear canal and external ear normal.     Nose: Nose normal.     Mouth/Throat:     Mouth: Mucous membranes are moist.  Eyes:     Extraocular Movements: Extraocular movements intact.     Conjunctiva/sclera: Conjunctivae normal.     Pupils: Pupils are equal, round, and reactive to light.  Cardiovascular:     Rate and Rhythm: Normal rate and regular rhythm.     Pulses: Normal pulses.     Heart sounds: Normal heart sounds.  Pulmonary:     Effort: Pulmonary effort is normal.     Breath  sounds: Normal breath sounds.     Comments: Dry coughing Abdominal:     General: Abdomen is flat. Bowel sounds are normal.     Palpations: Abdomen is soft.     Tenderness: There is no right CVA tenderness or left CVA tenderness.  Musculoskeletal:        General: Normal range of motion.     Cervical back: Normal range of motion and neck supple.  Skin:    General: Skin is warm and dry.  Neurological:     General: No focal deficit present.     Mental Status: She is alert and oriented to person, place, and time.  Psychiatric:        Mood and Affect: Mood normal.        Behavior: Behavior normal.        Thought Content: Thought content normal.        Judgment: Judgment normal.        Assessment & Plan:  Encounter for annual physical exam -     CBC with Differential/Platelet; Future -     Comprehensive metabolic panel; Future -     Lipid panel; Future -     TSH; Future  Acute cough  Diabetes mellitus screening -     Comprehensive metabolic panel; Future  Screening for cholesterol level -     Lipid panel; Future  Other orders -     Benzonatate; Take 1 capsule (100 mg total) by mouth 3 (three) times daily as needed for cough.  Dispense: 30 capsule; Refill: 0    Age-appropriate screening and counseling performed today. Will check labs and call with results. Preventive measures discussed and printed in AVS for patient.   Patient Counseling: [x]   Nutrition: Stressed importance of moderation in sodium/caffeine intake, saturated fat and cholesterol, caloric balance, sufficient intake of fresh fruits, vegetables, and fiber.  [x]   Stressed the importance of regular exercise.   []   Substance Abuse: Discussed cessation/primary prevention of tobacco, alcohol, or other drug use; driving or other dangerous activities under the influence; availability of treatment for abuse.   []   Injury prevention: Discussed safety belts, safety helmets, smoke detector, smoking near bedding or  upholstery.   []   Sexuality: Discussed sexually transmitted diseases, partner selection, use of condoms, avoidance of unintended pregnancy  and contraceptive alternatives.   [x]   Dental health: Discussed importance of regular tooth brushing, flossing, and dental visits.  [x]   Health maintenance and immunizations reviewed. Please refer to Health maintenance section.       Return in about 1 year (around 01/09/2023) for CPE, fasting.    Josslin Sanjuan M Jakira Mcfadden, PA-C

## 2022-01-15 ENCOUNTER — Other Ambulatory Visit: Payer: Self-pay

## 2022-01-15 ENCOUNTER — Encounter: Payer: Self-pay | Admitting: Physician Assistant

## 2022-01-15 MED ORDER — AZITHROMYCIN 250 MG PO TABS: ORAL_TABLET | ORAL | 0 refills | Status: AC

## 2022-01-15 NOTE — Telephone Encounter (Signed)
Please see pt msg and advise 

## 2022-01-23 ENCOUNTER — Other Ambulatory Visit (INDEPENDENT_AMBULATORY_CARE_PROVIDER_SITE_OTHER): Payer: No Typology Code available for payment source

## 2022-01-23 DIAGNOSIS — Z0000 Encounter for general adult medical examination without abnormal findings: Secondary | ICD-10-CM

## 2022-01-23 DIAGNOSIS — Z13220 Encounter for screening for lipoid disorders: Secondary | ICD-10-CM

## 2022-01-23 DIAGNOSIS — Z131 Encounter for screening for diabetes mellitus: Secondary | ICD-10-CM | POA: Diagnosis not present

## 2022-01-23 LAB — CBC WITH DIFFERENTIAL/PLATELET
Basophils Absolute: 0 10*3/uL (ref 0.0–0.1)
Basophils Relative: 0.4 % (ref 0.0–3.0)
Eosinophils Absolute: 0.2 10*3/uL (ref 0.0–0.7)
Eosinophils Relative: 1.9 % (ref 0.0–5.0)
HCT: 41 % (ref 36.0–46.0)
Hemoglobin: 13.6 g/dL (ref 12.0–15.0)
Lymphocytes Relative: 25 % (ref 12.0–46.0)
Lymphs Abs: 1.9 10*3/uL (ref 0.7–4.0)
MCHC: 33.1 g/dL (ref 30.0–36.0)
MCV: 95.3 fl (ref 78.0–100.0)
Monocytes Absolute: 0.6 10*3/uL (ref 0.1–1.0)
Monocytes Relative: 8.1 % (ref 3.0–12.0)
Neutro Abs: 5 10*3/uL (ref 1.4–7.7)
Neutrophils Relative %: 64.6 % (ref 43.0–77.0)
Platelets: 307 10*3/uL (ref 150.0–400.0)
RBC: 4.3 Mil/uL (ref 3.87–5.11)
RDW: 13.1 % (ref 11.5–15.5)
WBC: 7.8 10*3/uL (ref 4.0–10.5)

## 2022-01-23 LAB — COMPREHENSIVE METABOLIC PANEL
ALT: 14 U/L (ref 0–35)
AST: 18 U/L (ref 0–37)
Albumin: 4.3 g/dL (ref 3.5–5.2)
Alkaline Phosphatase: 60 U/L (ref 39–117)
BUN: 6 mg/dL (ref 6–23)
CO2: 30 mEq/L (ref 19–32)
Calcium: 9.8 mg/dL (ref 8.4–10.5)
Chloride: 102 mEq/L (ref 96–112)
Creatinine, Ser: 0.68 mg/dL (ref 0.40–1.20)
GFR: 114.92 mL/min (ref >60.00)
Glucose, Bld: 92 mg/dL (ref 70–99)
Potassium: 4.2 mEq/L (ref 3.5–5.1)
Sodium: 138 mEq/L (ref 135–145)
Total Bilirubin: 0.6 mg/dL (ref 0.2–1.2)
Total Protein: 6.9 g/dL (ref 6.0–8.3)

## 2022-01-23 LAB — LIPID PANEL
Cholesterol: 184 mg/dL (ref 0–200)
HDL: 72.8 mg/dL (ref >39.00)
LDL Cholesterol: 98 mg/dL (ref 0–99)
NonHDL: 110.75
Total CHOL/HDL Ratio: 3
Triglycerides: 64 mg/dL (ref 0.0–149.0)
VLDL: 12.8 mg/dL (ref 0.0–40.0)

## 2022-01-23 LAB — TSH: TSH: 0.87 u[IU]/mL (ref 0.35–5.50)

## 2022-02-06 ENCOUNTER — Other Ambulatory Visit: Payer: Self-pay | Admitting: Physician Assistant

## 2022-02-06 NOTE — Telephone Encounter (Signed)
Last OV: 01/08/22  Next OV: 01/13/23  Last filled: 04/18/20  Filled by historical provider

## 2022-02-07 ENCOUNTER — Other Ambulatory Visit: Payer: Self-pay

## 2022-02-07 NOTE — Telephone Encounter (Signed)
Patient requests to be sent a MyChart message for status of RX request

## 2022-03-04 ENCOUNTER — Ambulatory Visit (INDEPENDENT_AMBULATORY_CARE_PROVIDER_SITE_OTHER): Payer: No Typology Code available for payment source | Admitting: Physician Assistant

## 2022-03-04 ENCOUNTER — Encounter: Payer: Self-pay | Admitting: Physician Assistant

## 2022-03-04 VITALS — BP 124/84 | HR 66 | Temp 97.7°F | Ht 67.0 in | Wt 145.6 lb

## 2022-03-04 DIAGNOSIS — R091 Pleurisy: Secondary | ICD-10-CM | POA: Diagnosis not present

## 2022-03-04 MED ORDER — METHYLPREDNISOLONE 4 MG PO TBPK: ORAL_TABLET | ORAL | 0 refills | Status: DC

## 2022-03-04 NOTE — Progress Notes (Signed)
Subjective:    Patient ID: Mia Baker, female    DOB: Jun 01, 1990, 31 y.o.   MRN: [id number]  Chief Complaint  Patient presents with   Breast Pain    Pt in office c/o right breast pain and rib pain; coughing and sneezing causes pain, concerned with pleurisy, no pain to touch but when breathing in sharp pain; hasn't been sick recently; pain intense when laying down, pt also states trying to conceive and had IUD removed in August still no menstruation but some spotting.     HPI Patient is in today for right breast and rib pain.  02/16/22 - right side stabbing pain, worse with breathing, sometimes hurts to touch; taking anti-inflammatories; worse with laying down  Cough lasted Sept - middle of October, about 6-7 weeks   IUD removed middle of August, hasn't had a cycle yet, but some spotting  Past Medical History:  Diagnosis Date   1C/S - failure to progress: arrest of dilation and non-reassuring fetal status 06/15/2018   Allergy    Appendicitis 01/18/2015   Maternal anemia, with delivery 06/15/2018   Migraines    Postpartum care following cesarean delivery (3/9) 06/15/2018    Past Surgical History:  Procedure Laterality Date   APPENDECTOMY     CESAREAN SECTION N/A 06/14/2018   Procedure: CESAREAN SECTION;  Surgeon: Olivia Mackie, MD;  Location: [facility];  Service: Obstetrics;  Laterality: N/A;   LAPAROSCOPIC APPENDECTOMY N/A 01/19/2015   Procedure: APPENDECTOMY LAPAROSCOPIC;  Surgeon: Darnell Level, MD;  Location: [facility];  Service: General;  Laterality: N/A;   WISDOM TOOTH EXTRACTION      Family History  Problem Relation Age of Onset   Anemia Mother    Hypertension Father    Cancer Maternal Grandmother        unsure of type    Social History   Tobacco Use   Smoking status: Former   Smokeless tobacco: Never  Substance Use Topics   Alcohol use: Yes    Alcohol/week: 5.0 standard drinks of alcohol    Types: 5 Standard drinks or equivalent per week   Drug  use: Yes    Comment: marijuana - rare     Allergies  Allergen Reactions   Penicillins Other (See Comments)    Tolerated Zosyn Has patient had a PCN reaction causing immediate rash, facial/tongue/throat swelling, SOB or lightheadedness with hypotension: No Has patient had a PCN reaction causing severe rash involving mucus membranes or skin necrosis: No Has patient had a PCN reaction that required hospitalization No Has patient had a PCN reaction occurring within the last 10 years: No If all of the above answers are "NO", then may proceed with Cephalosporin use.Unsure of reaction, was as a child    Review of Systems NEGATIVE UNLESS OTHERWISE INDICATED IN HPI      Objective:     BP 124/84 (BP Location: Left Arm)   Pulse 66   Temp 97.7 F (36.5 C) (Temporal)   Ht 5\' 7"  (1.702 m)   Wt 145 lb 9.6 oz (66 kg)   SpO2 97%   Breastfeeding No   BMI 22.80 kg/m   Wt Readings from Last 3 Encounters:  03/04/22 145 lb 9.6 oz (66 kg)  01/08/22 144 lb 12.8 oz (65.7 kg)  12/18/21 148 lb 6.4 oz (67.3 kg)    BP Readings from Last 3 Encounters:  03/04/22 124/84  01/08/22 134/84  12/18/21 110/74     Physical Exam Vitals and nursing note reviewed.  Constitutional:      Appearance: Normal appearance. She is normal weight. She is not toxic-appearing.  HENT:     Head: Normocephalic and atraumatic.     Right Ear: Tympanic membrane, ear canal and external ear normal.     Left Ear: Tympanic membrane, ear canal and external ear normal.     Nose: Nose normal.     Mouth/Throat:     Mouth: Mucous membranes are moist.  Eyes:     Extraocular Movements: Extraocular movements intact.     Conjunctiva/sclera: Conjunctivae normal.     Pupils: Pupils are equal, round, and reactive to light.  Cardiovascular:     Rate and Rhythm: Normal rate and regular rhythm.     Pulses: Normal pulses.     Heart sounds: Normal heart sounds.     Comments: Chest pain not reproducible  Pulmonary:     Effort:  Pulmonary effort is normal.     Breath sounds: Normal breath sounds.  Musculoskeletal:        General: Normal range of motion.     Cervical back: Normal range of motion and neck supple.  Skin:    General: Skin is warm and dry.  Neurological:     General: No focal deficit present.     Mental Status: She is alert and oriented to person, place, and time.  Psychiatric:        Mood and Affect: Mood normal.        Behavior: Behavior normal.        Assessment & Plan:  Pleurisy  Other orders -     methylPREDNISolone; Please take per packaging instructions.  Dispense: 21 tablet; Refill: 0   No red flag symptoms on exam.  I think she has pleurisy secondary to postviral coughing that she previously experienced for about 6 to 7 weeks.  Over-the-counter anti-inflammatories not helping.  At this time, will recommend a Medrol Dosepak to see if this helps with inflammation.  Her lungs were clear to auscultation bilaterally.  She completed a Z-Pak previously.  Did not feel like chest x-ray was necessary today.  Patient will let me know how she is doing and follow-up sooner if any concerns.     Return in about 2 weeks (around 03/18/2022) for recheck.  This note was prepared with assistance of Conservation officer, historic buildings. Occasional wrong-word or sound-a-like substitutions may have occurred due to the inherent limitations of voice recognition software.    Lindzee Gouge M Marquies Wanat, PA-C

## 2022-03-11 ENCOUNTER — Ambulatory Visit: Payer: No Typology Code available for payment source | Admitting: Physician Assistant

## 2022-03-12 ENCOUNTER — Other Ambulatory Visit: Payer: Self-pay

## 2022-03-12 ENCOUNTER — Encounter: Payer: Self-pay | Admitting: Physician Assistant

## 2022-03-12 DIAGNOSIS — R091 Pleurisy: Secondary | ICD-10-CM

## 2022-03-12 NOTE — Telephone Encounter (Signed)
Please see pt msg and advise next steps for patient, thanks

## 2022-03-12 NOTE — Telephone Encounter (Signed)
Orders placed.

## 2022-03-17 ENCOUNTER — Ambulatory Visit (INDEPENDENT_AMBULATORY_CARE_PROVIDER_SITE_OTHER)
Admission: RE | Admit: 2022-03-17 | Discharge: 2022-03-17 | Disposition: A | Discharge status: Home / Self Care | Payer: No Typology Code available for payment source | Source: Ambulatory Visit | Attending: Physician Assistant | Admitting: Physician Assistant

## 2022-03-17 DIAGNOSIS — R091 Pleurisy: Secondary | ICD-10-CM

## 2022-03-17 NOTE — Telephone Encounter (Signed)
Spoke with patient and patient was advised again regarding chest Xray orders placed. Advised no appt necessary to go get Xray. Advised not to go between 12:30-1pm due to closed for lunch. Pt advised she was going to try and go this afternoon, and appreciated the return call. Pt verbalized understanding of instructions of where to go and times for X ray.

## 2022-03-18 ENCOUNTER — Other Ambulatory Visit: Payer: Self-pay

## 2022-03-18 ENCOUNTER — Encounter: Payer: Self-pay | Admitting: Physician Assistant

## 2022-03-18 DIAGNOSIS — R0781 Pleurodynia: Secondary | ICD-10-CM

## 2022-03-18 NOTE — Telephone Encounter (Signed)
Please see pt msg and advise if you would like her to schedule an office visit to be examined further

## 2022-03-20 NOTE — Progress Notes (Addendum)
    Aleen Sells D.[facility] [address] [place] [address] Phone: [phone]   Assessment and Plan:     1. Rib pain on right side -Chronic with exacerbation, initial sports medicine visit - Suspected combination of costochondritis from coughing during a URI combined with strain of serratus anterior/pectoralis minor and other rib cage musculature with upper extremity workouts - Start meloxicam 15 mg daily x2 weeks.  If still having pain after 2 weeks, complete 3rd-week of meloxicam. May use remaining meloxicam as needed once daily for pain control.  Do not to use additional NSAIDs while taking meloxicam.  May use Tylenol [phone] mg 2 to 3 times a day for breakthrough pain. -Start HEP for rib cage x-rays anterior - Discussed OMT.  Patient is interested in trying if it is covered by her insurance.  She can look into this and we could further discuss at follow-up visit - Recommend discontinuing upper extremity workouts for 2 weeks and then gradually reintroducing as tolerated  Other orders - meloxicam (MOBIC) 15 MG tablet; Take 1 tablet (15 mg total) by mouth daily.    Pertinent previous records reviewed include family medicine note 03/04/2022, family medicine telephone encounter 03/12/2022, 03/17/2022, 03/18/2022, CXR on 03/17/2022  Follow Up: 4 weeks for reevaluation.  Could consider OMT   Subjective:   I, Moenique Parris, am serving as a Neurosurgeon for Doctor Richardean Sale  Chief Complaint: right sided rib pain   HPI:   03/21/2022 Patient is a 31 year old female complaining of right sided rib pain. Patient states not TTP hurts to sneeze ,breathe, cough, been going on for a month , no MOI, prednisone dos pak didn't help, tylenol for the pain and that didn't help, no numnbess and tingling   Relevant Historical Information: None pertinent  Additional pertinent review of systems negative.   Current Outpatient Medications:    escitalopram  (LEXAPRO) 20 MG tablet, TAKE 1 TABLET BY MOUTH DAILY, Disp: 90 tablet, Rfl: 0   gabapentin (NEURONTIN) 800 MG tablet, Take by mouth., Disp: , Rfl:    meloxicam (MOBIC) 15 MG tablet, Take 1 tablet (15 mg total) by mouth daily., Disp: 30 tablet, Rfl: 0   propranolol (INDERAL) 10 MG tablet, Take 10 mg by mouth 2 (two) times daily., Disp: , Rfl:    Objective:     Vitals:   03/21/22 1003  BP: 108/82  Weight: 146 lb (66.2 kg)  Height: 5\' 7"  (1.702 m)      Body mass index is 22.87 kg/m.    Physical Exam:    Gen: Appears well, nad, nontoxic and pleasant Psych: Alert and oriented, appropriate mood and affect Neuro: sensation intact, strength is 5/5 in upper and lower extremities, muscle tone wnl Skin: no susupicious lesions or rashes  Back - Normal skin, Spine with normal alignment and no deformity.   No tenderness to vertebral process palpation.   Left paraspinous muscles are not tender and without spasm Pain in ribs 6-9 on right with shoulder flexion abduction and internal rotation Pain in ribs 6-9 on right with palpation of right-sided thoracic paraspinal and sternum and over ribs 6-9, primarily anterior axillary line No flail chest  Electronically signed by:  D.[facility] 10:24 AM 03/21/22

## 2022-03-21 ENCOUNTER — Ambulatory Visit (INDEPENDENT_AMBULATORY_CARE_PROVIDER_SITE_OTHER): Payer: No Typology Code available for payment source | Admitting: Sports Medicine

## 2022-03-21 VITALS — BP 108/82 | Ht 67.0 in | Wt 146.0 lb

## 2022-03-21 DIAGNOSIS — R0781 Pleurodynia: Secondary | ICD-10-CM | POA: Diagnosis not present

## 2022-03-21 MED ORDER — MELOXICAM 15 MG PO TABS: 15.0000 mg | ORAL_TABLET | Freq: Every day | ORAL | 0 refills | Status: DC

## 2022-03-21 NOTE — Patient Instructions (Addendum)
Good to see you Rib HEP  - Start meloxicam 15 mg daily x2 weeks.  If still having pain after 2 weeks, complete 3rd-week of meloxicam. May use remaining meloxicam as needed once daily for pain control.  Do not to use additional NSAIDs while taking meloxicam.  May use Tylenol [phone] mg 2 to 3 times a day for breakthrough pain. We discussed osteopathic manipulative treatment if you want to see if this covered by your insurance  Avoid upper extremity lifting for 2 weeks and then gradually increase 4 week follow up

## 2022-04-07 NOTE — L&D Delivery Note (Addendum)
Delivery Note At 1315 a viable and healthy female was delivered over intact perineum via vbac  (Presentation: cephalic;  ).  APGAR: 8,9 ; weight  not yet done. Placenta status: delivered spontaneously,incatc .  Cord:nucal, loose and reduced with delivery.   Anesthesia:  epidural Episiotomy:  none Lacerations:  bilat labial, 1st degree lac; periurethral Suture Repair: 3.0 vicryl Est. Blood Loss (mL):   Mom to postpartum.  Baby to Couplet care / Skin to Skin.  Vick Frees 03/25/2023, 1:54 PM

## 2022-04-18 ENCOUNTER — Ambulatory Visit: Payer: No Typology Code available for payment source | Admitting: Sports Medicine

## 2022-04-24 ENCOUNTER — Other Ambulatory Visit: Payer: Self-pay | Admitting: Physician Assistant

## 2022-05-17 ENCOUNTER — Other Ambulatory Visit: Payer: Self-pay | Admitting: Physician Assistant

## 2022-05-18 ENCOUNTER — Other Ambulatory Visit: Payer: Self-pay | Admitting: Physician Assistant

## 2022-05-19 ENCOUNTER — Telehealth: Payer: Self-pay | Admitting: Physician Assistant

## 2022-05-19 ENCOUNTER — Other Ambulatory Visit: Payer: Self-pay | Admitting: Physician Assistant

## 2022-05-19 NOTE — Telephone Encounter (Signed)
Medication   Patient Name: Mia Baker [facility] Gender: Female DOB: 12/29/1990 Age: 32 Y 1 M 14 D Return Phone Number: [phone] (Primary), [phone] (Secondary) Address: City/ State/ Zip: [place]   [address] Client [place] [facility] [place] Client [facility] [place] [facility] [place] Night Contact Type Call Who Is Calling Patient / Member / Family / Caregiver Call Type Triage / Clinical Caller Name Zimal Biedron Relationship To Patient Spouse Return Phone Number [phone] (Primary) Chief Complaint Prescription Refill or Medication Request (non symptomatic) Reason for Call Medication Question / Request Initial Comment Caller says that his wife needs a refill called in today. She ran out yesterday. No Sx. Sees Alyssa Allwardt, PA (not listed) Translation No Nurse Assessment Nurse: Donna Christen, RN, Legrand Como Date/Time Eilene Ghazi Time): 05/17/2022 12:32:02 PM Confirm and document reason for call. If symptomatic, describe symptoms. ---Caller says that his wife needs a refill called in today. She ran out yesterday. No Sx. Sees Alyssa Allwardt, PA (not listed). Escitalopram 37m takes once per day. denied symptoms at this time. [facility]- [phone] Does the patient have any new or worsening symptoms? ---No Nurse: GDonna Christen RN, MLegrand ComoDate/Time (Eastern Time): 05/17/2022 12:35:47 PM Please select the assessment type ---Refill Does the patient have enough medication to last until the office opens? ---Unable to obtain loaner dose from Pharmacy Does the client directives allow for assistance with medications after hours? ---Yes Was the medication filled within the last 6 months? ---Yes What is the name of the medication, dose and instructions as listed on the bottle? ---Escitalopram 266mtakes once per day Name of the physician as listed on the bottle. ---AlOskaloosaame and phone number where most recently filled.  ---[facility]- [phone] NOTE: All timestamps contained within this report are represented as [place] Time. CONFIDENTIALTY NOTICE: This fax transmission is intended only for the addressee. It contains information that is legally privileged, confidential or otherwise protected from use or disclosure. If you are not the intended recipient, you are strictly prohibited from reviewing, disclosing, copying using or disseminating any of this information or taking any action in reliance on or regarding this information. If you have received this fax in error, please notify usKoreammediately by telephone so that we can arrange for its return to usKoreaPhone: [phone]-Free: [phone]: [phone]: 50 of 3 Call Id: [id number] JonesTime (EEilene Ghaziime) Disposition Final User 05/17/2022 12:29:56 PM Send To Nurse AsKrista BlueRN, CaBarnetta Chapel/01/2023 12:43:41 PM Pharmacy Call GaDonna ChristenRN, MiLegrand Comoeason: Confirmed with Rx previous Lexapro order and called in 3 pills to due until office opens per directive. See Med assessment and order form. 05/17/2022 12:43:56 PM Clinical Call Yes GaDonna ChristenN, MiLegrand Comoinal Disposition 05/17/2022 12:43:56 PM Clinical Call Yes GaDonna ChristenRN, MiLegrand Comoerbal Orders/Maintenance Medications Medication Refill Route Dosage Regime Duration Admin Instructions User Name Escitalopram Yes Oral 2037mnce per day GarDoreene ElandicLegrand Como

## 2022-05-19 NOTE — Telephone Encounter (Signed)
Refill was sent to pharmacy this morning for patient

## 2022-05-20 ENCOUNTER — Encounter: Payer: Self-pay | Admitting: Physician Assistant

## 2022-05-20 ENCOUNTER — Ambulatory Visit: Payer: No Typology Code available for payment source | Admitting: Physician Assistant

## 2022-05-20 VITALS — BP 134/86 | HR 91 | Temp 98.4°F | Ht 67.0 in | Wt 146.2 lb

## 2022-05-20 DIAGNOSIS — N6322 Unspecified lump in the left breast, upper inner quadrant: Secondary | ICD-10-CM | POA: Diagnosis not present

## 2022-05-20 NOTE — Progress Notes (Signed)
Subjective:    Patient ID: Mia Baker, female    DOB: 1990/11/07, 32 y.o.   MRN: [id number]  Chief Complaint  Patient presents with   Breast Pain    Pt c/o left breast pain for over a week, pain had got better but still present; pt can feel squishy lump that hurts to touch; pt is also requesting Rx' be changed to 90 days vs 30 day; pt has appt for Pap in March,     HPI Patient is in today for concerns about possible mass left breast.  She states that over the last week she has noticed a spongy type mass on the upper part of her left breast.  Somewhat tender to palpation.  No redness around the area.  No discharge from the area.  No fever or chills.  No other concerns.  No personal history of cancer.  No family history of cancer.  Menstrual cycles are irregular.  Past Medical History:  Diagnosis Date   1C/S - failure to progress: arrest of dilation and non-reassuring fetal status 06/15/2018   Allergy    Appendicitis 01/18/2015   Maternal anemia, with delivery 06/15/2018   Migraines    Postpartum care following cesarean delivery (3/9) 06/15/2018    Past Surgical History:  Procedure Laterality Date   APPENDECTOMY     CESAREAN SECTION N/A 06/14/2018   Procedure: CESAREAN SECTION;  Surgeon: Brien Few, MD;  Location: [facility];  Service: Obstetrics;  Laterality: N/A;   LAPAROSCOPIC APPENDECTOMY N/A 01/19/2015   Procedure: APPENDECTOMY LAPAROSCOPIC;  Surgeon: Armandina Gemma, MD;  Location: [facility];  Service: General;  Laterality: N/A;   WISDOM TOOTH EXTRACTION      Family History  Problem Relation Age of Onset   Anemia Mother    Hypertension Father    Cancer Maternal Grandmother        unsure of type    Social History   Tobacco Use   Smoking status: Former   Smokeless tobacco: Never  Substance Use Topics   Alcohol use: Yes    Alcohol/week: 5.0 standard drinks of alcohol    Types: 5 Standard drinks or equivalent per week   Drug use: Yes    Comment: marijuana -  rare     Allergies  Allergen Reactions   Penicillins Other (See Comments)    Tolerated Zosyn Has patient had a PCN reaction causing immediate rash, facial/tongue/throat swelling, SOB or lightheadedness with hypotension: No Has patient had a PCN reaction causing severe rash involving mucus membranes or skin necrosis: No Has patient had a PCN reaction that required hospitalization No Has patient had a PCN reaction occurring within the last 10 years: No If all of the above answers are "NO", then may proceed with Cephalosporin use.Unsure of reaction, was as a child    Review of Systems NEGATIVE UNLESS OTHERWISE INDICATED IN HPI      Objective:     BP 134/86 (BP Location: Left Arm)   Pulse 91   Temp 98.4 F (36.9 C) (Temporal)   Ht 5' 7"$  (1.702 m)   Wt 146 lb 3.2 oz (66.3 kg)   SpO2 99%   BMI 22.90 kg/m   Wt Readings from Last 3 Encounters:  05/20/22 146 lb 3.2 oz (66.3 kg)  03/21/22 146 lb (66.2 kg)  03/04/22 145 lb 9.6 oz (66 kg)    BP Readings from Last 3 Encounters:  05/20/22 134/86  03/21/22 108/82  03/04/22 124/84     Physical Exam Vitals  and nursing note reviewed. Exam conducted with a chaperone present.  Constitutional:      Appearance: Normal appearance.  Cardiovascular:     Rate and Rhythm: Normal rate and regular rhythm.     Pulses: Normal pulses.  Pulmonary:     Effort: Pulmonary effort is normal.     Breath sounds: Normal breath sounds.  Chest:  Breasts:    Right: Normal.     Left: No swelling, bleeding, inverted nipple, nipple discharge, skin change or tenderness.    Lymphadenopathy:     Upper Body:     Right upper body: No supraclavicular, axillary or pectoral adenopathy.     Left upper body: No supraclavicular, axillary or pectoral adenopathy.  Neurological:     Mental Status: She is alert.  Psychiatric:        Mood and Affect: Mood normal.        Behavior: Behavior normal.        Assessment & Plan:  Mass of upper inner quadrant of  left breast -     US BREAST LTD UNI LEFT INC AXILLA; Future   Reassured patient no signs of abscess or cellulitis.  No indication for antibiotics today.  Most likely this area of concern is a cyst, but would like to check an ultrasound to be sure.  Order placed for this today.  Patient will be contacted to schedule.  She will let me know sooner if any changes or concerns,.     Return if symptoms worsen or fail to improve.  This note was prepared with assistance of Systems analyst. Occasional wrong-word or sound-a-like substitutions may have occurred due to the inherent limitations of voice recognition software.     Chequita Mofield M Adrain Nesbit, PA-C

## 2022-05-22 ENCOUNTER — Encounter: Payer: Self-pay | Admitting: Physician Assistant

## 2022-05-29 ENCOUNTER — Other Ambulatory Visit: Payer: Self-pay | Admitting: Physician Assistant

## 2022-05-29 DIAGNOSIS — N6322 Unspecified lump in the left breast, upper inner quadrant: Secondary | ICD-10-CM

## 2022-06-04 ENCOUNTER — Other Ambulatory Visit: Payer: No Typology Code available for payment source

## 2022-06-16 ENCOUNTER — Ambulatory Visit
Admission: RE | Admit: 2022-06-16 | Discharge: 2022-06-16 | Disposition: A | Discharge status: Home / Self Care | Payer: No Typology Code available for payment source | Source: Ambulatory Visit | Attending: Physician Assistant | Admitting: Physician Assistant

## 2022-06-16 DIAGNOSIS — N6322 Unspecified lump in the left breast, upper inner quadrant: Secondary | ICD-10-CM

## 2022-07-01 ENCOUNTER — Other Ambulatory Visit: Payer: Self-pay | Admitting: Physician Assistant

## 2022-07-01 ENCOUNTER — Telehealth: Payer: Self-pay | Admitting: Physician Assistant

## 2022-07-01 NOTE — Telephone Encounter (Signed)
Last OV: 05/20/22  Next OV: 01/13/23  Last filled by historical provider, please advise

## 2022-07-01 NOTE — Telephone Encounter (Signed)
  Encourage patient to contact the pharmacy for refills or they can request refills through [place]:  01/08/22 for cpe and 05/20/2022 ov   NEXT APPOINTMENT DATE:01/13/2023   MEDICATION:gabapentin   Is the patient out of medication? Yes   PHARMACY: [id number] - [place], [address] AT [address]      Let patient know to contact pharmacy at the end of the day to make sure medication is ready.  Please notify patient to allow 48-72 hours to process

## 2022-07-02 ENCOUNTER — Other Ambulatory Visit: Payer: Self-pay | Admitting: Physician Assistant

## 2022-07-02 MED ORDER — GABAPENTIN 800 MG PO TABS: 800.0000 mg | ORAL_TABLET | Freq: Every day | ORAL | 1 refills | Status: DC

## 2022-07-02 NOTE — Telephone Encounter (Signed)
Last OV: 05/20/22   Next OV: 01/13/23   Last filled by historical provider, please advise

## 2022-08-21 ENCOUNTER — Other Ambulatory Visit: Payer: Self-pay | Admitting: Physician Assistant

## 2022-08-29 LAB — OB RESULTS CONSOLE GC/CHLAMYDIA
Chlamydia: NEGATIVE
Neisseria Gonorrhea: NEGATIVE

## 2022-08-29 LAB — OB RESULTS CONSOLE RUBELLA ANTIBODY, IGM: Rubella: IMMUNE

## 2022-08-29 LAB — OB RESULTS CONSOLE HEPATITIS B SURFACE ANTIGEN: Hepatitis B Surface Ag: NEGATIVE

## 2022-08-29 LAB — OB RESULTS CONSOLE HIV ANTIBODY (ROUTINE TESTING): HIV: NONREACTIVE

## 2022-11-19 ENCOUNTER — Other Ambulatory Visit: Payer: Self-pay | Admitting: Physician Assistant

## 2023-01-13 ENCOUNTER — Encounter: Payer: No Typology Code available for payment source | Admitting: Physician Assistant

## 2023-02-19 LAB — OB RESULTS CONSOLE GBS: GBS: POSITIVE

## 2023-02-23 ENCOUNTER — Other Ambulatory Visit: Payer: Self-pay | Admitting: Physician Assistant

## 2023-03-24 ENCOUNTER — Other Ambulatory Visit: Payer: Self-pay

## 2023-03-24 ENCOUNTER — Encounter (HOSPITAL_COMMUNITY): Payer: Self-pay | Admitting: *Deleted

## 2023-03-24 ENCOUNTER — Inpatient Hospital Stay (HOSPITAL_COMMUNITY): Payer: No Typology Code available for payment source | Admitting: Anesthesiology

## 2023-03-24 ENCOUNTER — Inpatient Hospital Stay (HOSPITAL_COMMUNITY)
Admission: AD | Admit: 2023-03-24 | Discharge: 2023-03-26 | DRG: 807 | Disposition: A | Discharge status: Home / Self Care | Payer: No Typology Code available for payment source | Attending: Obstetrics and Gynecology | Admitting: Obstetrics and Gynecology

## 2023-03-24 DIAGNOSIS — Z3A40 40 weeks gestation of pregnancy: Secondary | ICD-10-CM | POA: Diagnosis not present

## 2023-03-24 DIAGNOSIS — F419 Anxiety disorder, unspecified: Secondary | ICD-10-CM | POA: Diagnosis present

## 2023-03-24 DIAGNOSIS — Z8249 Family history of ischemic heart disease and other diseases of the circulatory system: Secondary | ICD-10-CM | POA: Diagnosis not present

## 2023-03-24 DIAGNOSIS — O99344 Other mental disorders complicating childbirth: Secondary | ICD-10-CM | POA: Diagnosis present

## 2023-03-24 DIAGNOSIS — O9902 Anemia complicating childbirth: Secondary | ICD-10-CM | POA: Diagnosis present

## 2023-03-24 DIAGNOSIS — O34211 Maternal care for low transverse scar from previous cesarean delivery: Principal | ICD-10-CM | POA: Diagnosis present

## 2023-03-24 DIAGNOSIS — Z87891 Personal history of nicotine dependence: Secondary | ICD-10-CM | POA: Diagnosis not present

## 2023-03-24 DIAGNOSIS — O26893 Other specified pregnancy related conditions, third trimester: Secondary | ICD-10-CM | POA: Diagnosis present

## 2023-03-24 LAB — CBC
HCT: 41.3 % (ref 36.0–46.0)
Hemoglobin: 13.8 g/dL (ref 12.0–15.0)
MCH: 30.6 pg (ref 26.0–34.0)
MCHC: 33.4 g/dL (ref 30.0–36.0)
MCV: 91.6 fL (ref 80.0–100.0)
Platelets: 238 10*3/uL (ref 150–400)
RBC: 4.51 MIL/uL (ref 3.87–5.11)
RDW: 13.1 % (ref 11.5–15.5)
WBC: 14 10*3/uL — ABNORMAL HIGH (ref 4.0–10.5)
nRBC: 0 % (ref 0.0–0.2)

## 2023-03-24 LAB — TYPE AND SCREEN
ABO/RH(D): B POS
Antibody Screen: NEGATIVE

## 2023-03-24 MED ORDER — ONDANSETRON HCL 4 MG/2ML IJ SOLN
4.0000 mg | Freq: Four times a day (QID) | INTRAMUSCULAR | Status: DC | PRN
  Administered 2023-03-25 (×2): 4 mg via INTRAVENOUS
  Filled 2023-03-24 (×2): qty 2

## 2023-03-24 MED ORDER — ACETAMINOPHEN 325 MG PO TABS: 650.0000 mg | ORAL_TABLET | ORAL | Status: DC | PRN

## 2023-03-24 MED ORDER — OXYCODONE-ACETAMINOPHEN 5-325 MG PO TABS: 1.0000 | ORAL_TABLET | ORAL | Status: DC | PRN

## 2023-03-24 MED ORDER — CEFAZOLIN SODIUM-DEXTROSE 1-4 GM/50ML-% IV SOLN: 1.0000 g | Freq: Three times a day (TID) | INTRAVENOUS | Status: DC

## 2023-03-24 MED ORDER — LIDOCAINE-EPINEPHRINE (PF) 1.5 %-1:200000 IJ SOLN
INTRAMUSCULAR | Status: DC | PRN
  Administered 2023-03-24: 5 mL via EPIDURAL

## 2023-03-24 MED ORDER — LIDOCAINE HCL (PF) 1 % IJ SOLN
30.0000 mL | INTRAMUSCULAR | Status: DC | PRN
  Filled 2023-03-24: qty 30

## 2023-03-24 MED ORDER — CEFAZOLIN SODIUM-DEXTROSE 1-4 GM/50ML-% IV SOLN
1.0000 g | Freq: Three times a day (TID) | INTRAVENOUS | Status: DC
  Administered 2023-03-25: 1 g via INTRAVENOUS
  Filled 2023-03-24 (×3): qty 50

## 2023-03-24 MED ORDER — SOD CITRATE-CITRIC ACID 500-334 MG/5ML PO SOLN: 30.0000 mL | ORAL | Status: DC | PRN

## 2023-03-24 MED ORDER — EPHEDRINE 5 MG/ML INJ: 10.0000 mg | INTRAVENOUS | Status: DC | PRN

## 2023-03-24 MED ORDER — OXYTOCIN-SODIUM CHLORIDE 30-0.9 UT/500ML-% IV SOLN
1.0000 m[IU]/min | INTRAVENOUS | Status: DC
Start: 2023-03-25 — End: 2023-03-25
  Administered 2023-03-25: 2 m[IU]/min via INTRAVENOUS
  Filled 2023-03-24: qty 500

## 2023-03-24 MED ORDER — OXYTOCIN BOLUS FROM INFUSION
333.0000 mL | Freq: Once | INTRAVENOUS | Status: AC
  Administered 2023-03-25: 333 mL via INTRAVENOUS

## 2023-03-24 MED ORDER — LACTATED RINGERS IV SOLN
500.0000 mL | Freq: Once | INTRAVENOUS | Status: AC
  Administered 2023-03-24: 500 mL via INTRAVENOUS

## 2023-03-24 MED ORDER — CEFAZOLIN SODIUM-DEXTROSE 2-4 GM/100ML-% IV SOLN
2.0000 g | Freq: Once | INTRAVENOUS | Status: AC
  Administered 2023-03-24: 2 g via INTRAVENOUS
  Filled 2023-03-24: qty 100

## 2023-03-24 MED ORDER — PHENYLEPHRINE 80 MCG/ML (10ML) SYRINGE FOR IV PUSH (FOR BLOOD PRESSURE SUPPORT)
80.0000 ug | PREFILLED_SYRINGE | INTRAVENOUS | Status: DC | PRN
Start: 2023-03-24 — End: 2023-03-25

## 2023-03-24 MED ORDER — PHENYLEPHRINE 80 MCG/ML (10ML) SYRINGE FOR IV PUSH (FOR BLOOD PRESSURE SUPPORT)
80.0000 ug | PREFILLED_SYRINGE | INTRAVENOUS | Status: DC | PRN
  Filled 2023-03-24: qty 10

## 2023-03-24 MED ORDER — OXYCODONE-ACETAMINOPHEN 5-325 MG PO TABS: 2.0000 | ORAL_TABLET | ORAL | Status: DC | PRN

## 2023-03-24 MED ORDER — FENTANYL-BUPIVACAINE-NACL 0.5-0.125-0.9 MG/250ML-% EP SOLN
12.0000 mL/h | EPIDURAL | Status: DC | PRN
  Administered 2023-03-24: 12 mL/h via EPIDURAL

## 2023-03-24 MED ORDER — DIPHENHYDRAMINE HCL 50 MG/ML IJ SOLN: 12.5000 mg | INTRAMUSCULAR | Status: DC | PRN

## 2023-03-24 MED ORDER — LACTATED RINGERS IV SOLN: INTRAVENOUS | Status: DC

## 2023-03-24 MED ORDER — LACTATED RINGERS IV SOLN: 500.0000 mL | INTRAVENOUS | Status: DC | PRN

## 2023-03-24 MED ORDER — FENTANYL-BUPIVACAINE-NACL 0.5-0.125-0.9 MG/250ML-% EP SOLN
EPIDURAL | Status: AC
  Filled 2023-03-24: qty 250

## 2023-03-24 MED ORDER — OXYTOCIN-SODIUM CHLORIDE 30-0.9 UT/500ML-% IV SOLN
2.5000 [IU]/h | INTRAVENOUS | Status: DC
  Administered 2023-03-25: 2.5 [IU]/h via INTRAVENOUS

## 2023-03-24 MED ORDER — FLEET ENEMA RE ENEM: 1.0000 | ENEMA | RECTAL | Status: DC | PRN

## 2023-03-24 MED ORDER — TERBUTALINE SULFATE 1 MG/ML IJ SOLN
0.2500 mg | Freq: Once | INTRAMUSCULAR | Status: AC | PRN
  Administered 2023-03-25: 0.25 mg via SUBCUTANEOUS
  Filled 2023-03-24: qty 1

## 2023-03-24 NOTE — MAU Note (Signed)
.  Mia Baker is a 32 y.o. at [redacted]w[redacted]d here in MAU reporting: ctx all day have gotten more regular the past 2 hrs. Q4-7 min. Reports some pink discharge this morning no leaking or bleeding. Good fetal movement reported. 3cm dilated last week in office.  LMP:  Onset of complaint: 2 hrs Pain score: 7 Vitals:   03/24/23 2157  BP: 136/83  Pulse: 94  Resp: 18  Temp: 98 F (36.7 C)     FHT:147 Lab orders placed from triage:  labor eval

## 2023-03-24 NOTE — Anesthesia Preprocedure Evaluation (Signed)
Anesthesia Evaluation  Patient identified by MRN, date of birth, ID band Patient awake    Reviewed: Allergy & Precautions, NPO status , Patient's Chart, lab work & pertinent test results  Airway Mallampati: I  TM Distance: >3 FB Neck ROM: Full    Dental no notable dental hx.    Pulmonary neg pulmonary ROS, former smoker   Pulmonary exam normal        Cardiovascular negative cardio ROS  Rhythm:Regular Rate:Normal     Neuro/Psych  Headaches  Anxiety        GI/Hepatic negative GI ROS, Neg liver ROS,,,  Endo/Other  negative endocrine ROS    Renal/GU negative Renal ROS  negative genitourinary   Musculoskeletal negative musculoskeletal ROS (+)    Abdominal Normal abdominal exam  (+)   Peds  Hematology  (+) Blood dyscrasia, anemia Lab Results      Component                Value               Date                      WBC                      14.0 (H)            03/24/2023                HGB                      13.8                03/24/2023                HCT                      41.3                03/24/2023                MCV                      91.6                03/24/2023                PLT                      238                 03/24/2023              Anesthesia Other Findings   Reproductive/Obstetrics (+) Pregnancy                             Anesthesia Physical Anesthesia Plan  ASA: 2  Anesthesia Plan: Epidural   Post-op Pain Management:    Induction:   PONV Risk Score and Plan: 2 and Treatment may vary due to age or medical condition  Airway Management Planned: Natural Airway  Additional Equipment: None  Intra-op Plan:   Post-operative Plan:   Informed Consent: I have reviewed the patients History and Physical, chart, labs and discussed the procedure including the risks, benefits and alternatives for the proposed anesthesia with the patient or authorized  representative who has indicated  his/her understanding and acceptance.     Dental advisory given  Plan Discussed with:   Anesthesia Plan Comments:        Anesthesia Quick Evaluation

## 2023-03-24 NOTE — Anesthesia Procedure Notes (Signed)
Epidural Patient location during procedure: OB Start time: 03/24/2023 11:22 PM End time: 03/24/2023 11:27 PM  Staffing Anesthesiologist: Atilano Median, DO Performed: anesthesiologist   Preanesthetic Checklist Completed: patient identified, IV checked, site marked, risks and benefits discussed, surgical consent, monitors and equipment checked, pre-op evaluation and timeout performed  Epidural Patient position: sitting Prep: ChloraPrep Patient monitoring: heart rate, continuous pulse ox and blood pressure Approach: midline Location: L4-L5 Injection technique: LOR saline  Needle:  Needle type: Tuohy  Needle gauge: 17 G Needle length: 9 cm Needle insertion depth: 5.5 cm Catheter type: closed end flexible Catheter size: 20 Guage Catheter at skin depth: 11 cm Test dose: negative and 1.5% lidocaine with Epi 1:200 K  Assessment Events: blood not aspirated, no cerebrospinal fluid, injection not painful, no injection resistance and no paresthesia  Additional Notes  Patient identified. Risks/Benefits/Options discussed with patient including but not limited to bleeding, infection, nerve damage, paralysis, failed block, incomplete pain control, headache, blood pressure changes, nausea, vomiting, reactions to medications, itching and postpartum back pain. Confirmed with bedside nurse the patient's most recent platelet count. Confirmed with patient that they are not currently taking any anticoagulation, have any bleeding history or any family history of bleeding disorders. Patient expressed understanding and wished to proceed. All questions were answered. Sterile technique was used throughout the entire procedure. Please see nursing notes for vital signs. Test dose was given through epidural catheter and negative prior to continuing to dose epidural or start infusion. Warning signs of high block given to the patient including shortness of breath, tingling/numbness in hands, complete motor  block, or any concerning symptoms with instructions to call for help. Patient was given instructions on fall risk and not to get out of bed. All questions and concerns addressed with instructions to call with any issues or inadequate analgesia.    Reason for block:procedure for pain

## 2023-03-25 ENCOUNTER — Encounter (HOSPITAL_COMMUNITY): Payer: Self-pay | Admitting: Obstetrics and Gynecology

## 2023-03-25 LAB — RPR: RPR Ser Ql: NONREACTIVE

## 2023-03-25 MED ORDER — BENZOCAINE-MENTHOL 20-0.5 % EX AERO
1.0000 | INHALATION_SPRAY | CUTANEOUS | Status: DC | PRN
  Administered 2023-03-25: 1 via TOPICAL
  Filled 2023-03-25: qty 56

## 2023-03-25 MED ORDER — IBUPROFEN 600 MG PO TABS
600.0000 mg | ORAL_TABLET | Freq: Four times a day (QID) | ORAL | Status: DC
  Administered 2023-03-25 – 2023-03-26 (×4): 600 mg via ORAL
  Filled 2023-03-25 (×4): qty 1

## 2023-03-25 MED ORDER — ESCITALOPRAM OXALATE 10 MG PO TABS
20.0000 mg | ORAL_TABLET | Freq: Every day | ORAL | Status: DC
  Administered 2023-03-25: 20 mg via ORAL
  Filled 2023-03-25 (×2): qty 2

## 2023-03-25 MED ORDER — TETANUS-DIPHTH-ACELL PERTUSSIS 5-2.5-18.5 LF-MCG/0.5 IM SUSY: 0.5000 mL | PREFILLED_SYRINGE | Freq: Once | INTRAMUSCULAR | Status: DC

## 2023-03-25 MED ORDER — SENNOSIDES-DOCUSATE SODIUM 8.6-50 MG PO TABS
2.0000 | ORAL_TABLET | Freq: Every day | ORAL | Status: DC
  Administered 2023-03-26: 2 via ORAL
  Filled 2023-03-25: qty 2

## 2023-03-25 MED ORDER — WITCH HAZEL-GLYCERIN EX PADS
1.0000 | MEDICATED_PAD | CUTANEOUS | Status: DC | PRN
  Administered 2023-03-25: 1 via TOPICAL

## 2023-03-25 MED ORDER — DIPHENHYDRAMINE HCL 25 MG PO CAPS: 25.0000 mg | ORAL_CAPSULE | Freq: Four times a day (QID) | ORAL | Status: DC | PRN

## 2023-03-25 MED ORDER — SIMETHICONE 80 MG PO CHEW: 80.0000 mg | CHEWABLE_TABLET | ORAL | Status: DC | PRN

## 2023-03-25 MED ORDER — PRENATAL MULTIVITAMIN CH
1.0000 | ORAL_TABLET | Freq: Every day | ORAL | Status: DC
  Administered 2023-03-26: 1 via ORAL
  Filled 2023-03-25: qty 1

## 2023-03-25 MED ORDER — ONDANSETRON HCL 4 MG PO TABS: 4.0000 mg | ORAL_TABLET | ORAL | Status: DC | PRN

## 2023-03-25 MED ORDER — ONDANSETRON HCL 4 MG/2ML IJ SOLN: 4.0000 mg | INTRAMUSCULAR | Status: DC | PRN

## 2023-03-25 MED ORDER — COCONUT OIL OIL: 1.0000 | TOPICAL_OIL | Status: DC | PRN

## 2023-03-25 MED ORDER — DIBUCAINE (PERIANAL) 1 % EX OINT: 1.0000 | TOPICAL_OINTMENT | CUTANEOUS | Status: DC | PRN

## 2023-03-25 MED ORDER — ACETAMINOPHEN 325 MG PO TABS
650.0000 mg | ORAL_TABLET | ORAL | Status: DC | PRN
  Administered 2023-03-25 – 2023-03-26 (×4): 650 mg via ORAL
  Filled 2023-03-25 (×4): qty 2

## 2023-03-25 NOTE — Lactation Note (Signed)
This note was copied from a baby's chart. Lactation Consultation Note  Patient Name: Mia Baker HQION'G Date: 03/25/2023 Age:34 hours Reason for consult: Initial assessment;Term;Breastfeeding assistance  P2- MOB reports that infant's first feeding went great, but it is time for his next feeding and he would not wake up. LC offered to assist and MOB agreed. LC stimulated infant into being more alert, then placed him on MOB's right breast in the football position. MOB and LC attempted to latch infant multiple times, but he was too sleepy. LC reviewed how this is normal first 24 hr behavior.  LC encouraged hand expression and offering EBM. MOB agreed. MOB was holding infant, so she asked LC to express. LC was able to collect 3 mL EBM within 2 minutes. More could have been collected, but her breasts were tender at this time. LC then spoon fed infant the EBM. Infant tolerated feeding well. LC encouraged MOB to offer EBM if infant is too sleepy to nurse.   LC reviewed feeding infant on cue 8-12x in 24 hrs, not allowing infant to go over 3 hrs without a feeding, CDC milk storage guidelines and LC services handout. LC encouraged MOB to call for further assistance as needed.  Maternal Data Has patient been taught Hand Expression?: Yes Does the patient have breastfeeding experience prior to this delivery?: Yes How long did the patient breastfeed?: 9 months, then infant stopped herself.  Feeding Mother's Current Feeding Choice: Breast Milk  LATCH Score Latch: Too sleepy or reluctant, no latch achieved, no sucking elicited.  Audible Swallowing: None  Type of Nipple: Everted at rest and after stimulation  Comfort (Breast/Nipple): Soft / non-tender  Hold (Positioning): Assistance needed to correctly position infant at breast and maintain latch.  LATCH Score: 5   Lactation Tools Discussed/Used Pump Education: Milk Storage  Interventions Interventions: Breast feeding basics  reviewed;Assisted with latch;Hand express;Breast compression;Adjust position;Support pillows;Position options;Expressed milk;Education;LC Services brochure  Discharge Discharge Education: Warning signs for feeding baby Pump: DEBP;Hands Free;Personal  Consult Status Consult Status: Follow-up Date: 03/26/23 Follow-up type: In-patient    Dema Severin BS, IBCLC 03/25/2023, 6:47 PM

## 2023-03-25 NOTE — H&P (Addendum)
Mia Baker is a 32 y.o. G2P1001 at [redacted]w[redacted]d gestation presents for complaint of Contractions. Denies vb, lof. +FM. H/o ltcs, plans TOLAC.  Antepartum course:  h/o c/s   [facility] at [facility] since 10 wks.  See complete pre-natal records  History OB History     Gravida  2   Para  1   Term  1   Preterm      AB      Living  1      SAB      IAB      Ectopic      Multiple  0   Live Births  1          Past Medical History:  Diagnosis Date   1C/S - failure to progress: arrest of dilation and non-reassuring fetal status 06/15/2018   Allergy    Appendicitis 01/18/2015   Maternal anemia, with delivery 06/15/2018   Migraines    Postpartum care following cesarean delivery (3/9) 06/15/2018   Past Surgical History:  Procedure Laterality Date   APPENDECTOMY     CESAREAN SECTION N/A 06/14/2018   Procedure: CESAREAN SECTION;  Surgeon: Olivia Mackie, MD;  Location: [facility];  Service: Obstetrics;  Laterality: N/A;   LAPAROSCOPIC APPENDECTOMY N/A 01/19/2015   Procedure: APPENDECTOMY LAPAROSCOPIC;  Surgeon: Darnell Level, MD;  Location: [facility];  Service: General;  Laterality: N/A;   WISDOM TOOTH EXTRACTION     Family History: family history includes Anemia in her mother; Cancer in her maternal grandmother; Hypertension in her father. Social History:  reports that she has quit smoking. She has never used smokeless tobacco. She reports current alcohol use of about 5.0 standard drinks of alcohol per week. She reports current drug use.  ROS: See above otherwise negative  Prenatal labs:  ABO, Rh: --/--/B POS (12/17 2223) Antibody: NEG (12/17 2223) Rubella: Immune (05/24 0000) RPR:    HBsAg: Negative (05/24 0000)  HIV:Non-reactive (05/24 0000)  GBS:    1 hr Glucola: Normal Genetic screening: Normal Anatomy US: Normal  Physical Exam:   Dilation: 8.5 Effacement (%): 80 Station: -1 Exam by:: Dr. Amado Nash Blood pressure 101/70, pulse 92, temperature 97.8 F  (36.6 C), temperature source Oral, resp. rate 18, height 5\' 7"  (1.702 m), weight 81.2 kg, SpO2 99%. A&O x 3 HEENT: Normal Lungs: CTAB CV: RRR Abdominal: Soft, Non-tender, and Gravid; efw 7-7 1/2 Lower Extremities: Non-edematous, Non-tender  Pelvic Exam:      Dilatation: 8.5, more anterior and left     Effacement: 100%     Station: -1 to 0     Presentation: Cephalic  Labs:  CBC:  Lab Results  Component Value Date   WBC 14.0 (H) 03/24/2023   RBC 4.51 03/24/2023   HGB 13.8 03/24/2023   HCT 41.3 03/24/2023   MCV 91.6 03/24/2023   MCH 30.6 03/24/2023   MCHC 33.4 03/24/2023   RDW 13.1 03/24/2023   PLT 238 03/24/2023   CMP:  Lab Results  Component Value Date   NA 138 01/23/2022   K 4.2 01/23/2022   CL 102 01/23/2022   CO2 30 01/23/2022   GLUCOSE 92 01/23/2022   BUN 6 01/23/2022   CREATININE 0.68 01/23/2022   CALCIUM 9.8 01/23/2022   PROT 6.9 01/23/2022   AST 18 01/23/2022   ALT 14 01/23/2022   ALBUMIN 4.3 01/23/2022   ALKPHOS 60 01/23/2022   BILITOT 0.6 01/23/2022   GFRNONAA >60 01/21/2015   GFRAA >60 01/21/2015   ANIONGAP 5  01/21/2015   Urine: Lab Results  Component Value Date   COLORURINE YELLOW 01/22/2015   APPEARANCEUR CLEAR 01/22/2015   LABSPEC 1.006 01/22/2015   PHURINE 7.0 01/22/2015   GLUCOSEU NEGATIVE 01/22/2015   HGBUR NEGATIVE 01/22/2015   BILIRUBINUR NEGATIVE 01/22/2015   KETONESUR NEGATIVE 01/22/2015   PROTEINUR NEGATIVE 01/22/2015   NITRITE NEGATIVE 01/22/2015   LEUKOCYTESUR MODERATE (A) 01/22/2015     Prenatal Transfer Tool  Maternal Diabetes: No Genetic Screening: Normal Maternal Ultrasounds/Referrals: Normal Fetal Ultrasounds or other Referrals:  None Maternal Substance Abuse:  No Significant Maternal Medications:  None Significant Maternal Lab Results: Group B Strep negative Number of Prenatal Visits:greater than 3 verified prenatal visits Other Comments:  None    Assessment/Plan:  32 y.o. G2P1001 at [redacted]w[redacted]d gestation    Active labor - admit for delivery; h/o LTCS, wants tolac, accepts risks including risk of uterine rupture; s/p epidural and comforatble; making good cervical change; plan svd Fetal status reassuring Gbs pos - ancef for prophylaxis RH po RI   Vick Frees 03/25/2023, 6:16 AM

## 2023-03-26 LAB — CBC
HCT: 32.6 % — ABNORMAL LOW (ref 36.0–46.0)
Hemoglobin: 11.1 g/dL — ABNORMAL LOW (ref 12.0–15.0)
MCH: 30.8 pg (ref 26.0–34.0)
MCHC: 34 g/dL (ref 30.0–36.0)
MCV: 90.6 fL (ref 80.0–100.0)
Platelets: 177 10*3/uL (ref 150–400)
RBC: 3.6 MIL/uL — ABNORMAL LOW (ref 3.87–5.11)
RDW: 13.2 % (ref 11.5–15.5)
WBC: 15.4 10*3/uL — ABNORMAL HIGH (ref 4.0–10.5)
nRBC: 0 % (ref 0.0–0.2)

## 2023-03-26 MED ORDER — IBUPROFEN 600 MG PO TABS: 600.0000 mg | ORAL_TABLET | Freq: Four times a day (QID) | ORAL | 0 refills | Status: AC

## 2023-03-26 MED ORDER — OXYCODONE HCL 5 MG PO TABS: 5.0000 mg | ORAL_TABLET | Freq: Four times a day (QID) | ORAL | Status: DC | PRN

## 2023-03-26 MED ORDER — OXYCODONE HCL 5 MG PO TABS: 5.0000 mg | ORAL_TABLET | Freq: Four times a day (QID) | ORAL | 0 refills | Status: AC | PRN

## 2023-03-26 MED ORDER — STIMULANT LAXATIVE 8.6-50 MG PO TABS: 1.0000 | ORAL_TABLET | Freq: Every day | ORAL | 0 refills | Status: AC

## 2023-03-26 MED ORDER — ACETAMINOPHEN 325 MG PO TABS: 650.0000 mg | ORAL_TABLET | ORAL | Status: AC | PRN

## 2023-03-26 MED ORDER — ESCITALOPRAM OXALATE 20 MG PO TABS: 20.0000 mg | ORAL_TABLET | Freq: Every day | ORAL | 0 refills | Status: AC

## 2023-03-26 NOTE — Lactation Note (Signed)
This note was copied from a baby's chart. Lactation Consultation Note  Patient Name: Mia Baker WNUUV'O Date: 03/26/2023 Age:34 hours  Reason for consult: Follow-up assessment;Term;Breastfeeding assistance;Difficult latch  P2, [redacted]w[redacted]d, 1.41% weight loss  Mother requesting LC assistance for latching baby. Mother has breastfeeding experience. Baby has been latching shallow and pinching. Basic breastfeeding education with baby in football and cross cradle hold. "[name]" latched better and deeper onto the breast in cross cradle. Mother was bale to express colostrum prior to latch and she is doing breast compression during this feeding. Baby's gape is wide, non-painful latch and wide gape. Mother denied discomfort.  Mother hopeful that she and baby will be discharged today. Mom made aware of O/P services, breastfeeding support groups,  and our phone # for post-discharge questions.     Maternal Data    Feeding Mother's Current Feeding Choice: Breast Milk  LATCH Score Latch: Repeated attempts needed to sustain latch, nipple held in mouth throughout feeding, stimulation needed to elicit sucking reflex.  Audible Swallowing: A few with stimulation  Type of Nipple: Everted at rest and after stimulation  Comfort (Breast/Nipple): Soft / non-tender  Hold (Positioning): Assistance needed to correctly position infant at breast and maintain latch.  LATCH Score: 7    Discharge Discharge Education: Warning signs for feeding baby Pump: DEBP;Hands Free;Personal  Consult Status Consult Status: Complete Date: 03/26/23    Omar Person 03/26/2023, 2:13 PM

## 2023-03-26 NOTE — Discharge Summary (Signed)
Postpartum Discharge Summary    Patient Name: Mia Baker DOB: 28-Nov-1990 MRN: [id number]  Date of admission: 03/24/2023 Delivery date:03/25/2023 Delivering provider: Rhoderick Moody E Date of discharge: 03/26/2023  Admitting diagnosis: Normal labor [O80, Z37.9] Intrauterine pregnancy: [redacted]w[redacted]d     Additional problems: well controlled anxiety on Lexapro     Discharge diagnosis: VBAC                                              Post partum procedures: NONE Augmentation: Pitocin Complications: None  Hospital course: Onset of Labor With Vaginal Delivery      32 y.o. yo G2P2002 at [redacted]w[redacted]d was admitted in Active Labor on 03/24/2023. Labor course was complicated by none  Membrane Rupture Time/Date: 6:09 AM,03/25/2023  Delivery Method:VBAC, Spontaneous Operative Delivery:N/A Episiotomy: None Lacerations:  Labial;Periurethral;Vaginal Patient had a postpartum course complicated by none.  She is ambulating, tolerating a regular diet, passing flatus, and urinating well. Patient is discharged home in stable condition on 03/26/23.  Newborn Data: Birth date:03/25/2023 Birth time:1:15 PM Gender:Female Living status:Living Apgars:8 ,9  Weight:3200 g  Magnesium Sulfate received: No BMZ received: No Rhophylac:N/A MMR:N/A T-DaP:Given prenatally Flu: Yes RSV Vaccine received: No Transfusion:No Immunizations administered: Immunization History  Administered Date(s) Administered   Influenza,inj,Quad PF,6+ Mos 12/18/2021   Moderna Sars-Covid-2 Vaccination 06/06/2019, 07/04/2019   Pfizer Covid-19 Vaccine Bivalent Booster 5y-11y 02/02/2020   Tdap 03/26/2018    Physical exam  Vitals:   03/25/23 1632 03/25/23 2042 03/25/23 2325 03/26/23 0636  BP: 103/71 115/71 109/69 114/79  Pulse: (!) 104 87 82 75  Resp: 18 18 16 16   Temp: 98.6 F (37 C) 97.9 F (36.6 C) 97.7 F (36.5 C) 98.1 F (36.7 C)  TempSrc: Oral Oral Oral Oral  SpO2: 98%     Weight:      Height:        General: alert, cooperative, and no distress Lochia: appropriate Uterine Fundus: firm Incision: Healing well with no significant drainage, No significant erythema DVT Evaluation: No evidence of DVT seen on physical exam. Negative Homan's sign. Labs: Lab Results  Component Value Date   WBC 15.4 (H) 03/26/2023   HGB 11.1 (L) 03/26/2023   HCT 32.6 (L) 03/26/2023   MCV 90.6 03/26/2023   PLT 177 03/26/2023      Latest Ref Rng & Units 01/23/2022    8:32 AM  CMP  Glucose 70 - 99 mg/dL 92   BUN 6 - 23 mg/dL 6   Creatinine 9.56 - 2.13 mg/dL 0.86   Sodium 578 - 469 mEq/L 138   Potassium 3.5 - 5.1 mEq/L 4.2   Chloride 96 - 112 mEq/L 102   CO2 19 - 32 mEq/L 30   Calcium 8.4 - 10.5 mg/dL 9.8   Total Protein 6.0 - 8.3 g/dL 6.9   Total Bilirubin 0.2 - 1.2 mg/dL 0.6   Alkaline Phos 39 - 117 U/L 60   AST 0 - 37 U/L 18   ALT 0 - 35 U/L 14    Edinburgh Score:    03/25/2023    3:31 PM  Edinburgh Postnatal Depression Scale Screening Tool  I have been able to laugh and see the funny side of things. 0  I have looked forward with enjoyment to things. 0  I have blamed myself unnecessarily when things went wrong. 0  I have been anxious  or worried for no good reason. 1  I have felt scared or panicky for no good reason. 0  Things have been getting on top of me. 0  I have been so unhappy that I have had difficulty sleeping. 0  I have felt sad or miserable. 0  I have been so unhappy that I have been crying. 0  The thought of harming myself has occurred to me. 0  Edinburgh Postnatal Depression Scale Total 1      After visit meds:  Allergies as of 03/26/2023       Reactions   Penicillins Other (See Comments)   Tolerated Zosyn Has patient had a PCN reaction causing immediate rash, facial/tongue/throat swelling, SOB or lightheadedness with hypotension: No Has patient had a PCN reaction causing severe rash involving mucus membranes or skin necrosis: No Has patient had a PCN reaction  that required hospitalization No Has patient had a PCN reaction occurring within the last 10 years: No If all of the above answers are "NO", then may proceed with Cephalosporin use.Unsure of reaction, was as a child        Medication List     STOP taking these medications    gabapentin 800 MG tablet Commonly known as: NEURONTIN   meloxicam 15 MG tablet Commonly known as: MOBIC   propranolol 10 MG tablet Commonly known as: INDERAL       TAKE these medications    acetaminophen 325 MG tablet Commonly known as: Tylenol Take 2 tablets (650 mg total) by mouth every 4 (four) hours as needed (for pain scale < 4).   escitalopram 20 MG tablet Commonly known as: LEXAPRO TAKE 1 TABLET BY MOUTH DAILY What changed: Another medication with the same name was added. Make sure you understand how and when to take each.   escitalopram 20 MG tablet Commonly known as: LEXAPRO Take 1 tablet (20 mg total) by mouth daily. What changed: You were already taking a medication with the same name, and this prescription was added. Make sure you understand how and when to take each.   ibuprofen 600 MG tablet Commonly known as: ADVIL Take 1 tablet (600 mg total) by mouth every 6 (six) hours.   oxyCODONE 5 MG immediate release tablet Commonly known as: Oxy IR/ROXICODONE Take 1 tablet (5 mg total) by mouth every 6 (six) hours as needed for up to 3 days for moderate pain (pain score 4-6).   Stimulant Laxative 8.6-50 MG tablet Generic drug: senna-docusate Take 1 tablet by mouth at bedtime for 14 days. Start taking on: March 27, 2023         Discharge home in stable condition Infant Feeding: Breast Infant Disposition:home with mother Discharge instruction: per After Visit Summary and Postpartum booklet. Activity: Advance as tolerated. Pelvic rest for 6 weeks.  Diet: routine diet Anticipated Birth Control: IUD Postpartum Appointment:6 weeks Additional Postpartum F/U:  sooner as needed   Future Appointments:No future appointments. Follow up Visit:  Follow-up Information     Noland Fordyce, MD Follow up in 6 week(s).   Specialty: Obstetrics and Gynecology Why: PP check and IUD placement Contact information: [address] [place] [address] [phone]                     03/26/2023 Robley Fries, MD

## 2023-03-26 NOTE — Anesthesia Postprocedure Evaluation (Signed)
Anesthesia Post Note  Patient: Mia Baker  Procedure(s) Performed: AN AD HOC LABOR EPIDURAL     Patient location during evaluation: Mother Baby Anesthesia Type: Epidural Level of consciousness: awake and alert Pain management: pain level controlled Vital Signs Assessment: post-procedure vital signs reviewed and stable Respiratory status: spontaneous breathing, nonlabored ventilation and respiratory function stable Cardiovascular status: stable Postop Assessment: no headache, no backache and epidural receding Anesthetic complications: no   No notable events documented.  Last Vitals:  Vitals:   03/25/23 2325 03/26/23 0636  BP: 109/69 114/79  Pulse: 82 75  Resp: 16 16  Temp: 36.5 C 36.7 C  SpO2:      Last Pain:  Vitals:   03/26/23 0637  TempSrc:   PainSc: 5    Pain Goal:                   Mia Baker

## 2023-03-26 NOTE — Progress Notes (Signed)
Post Partum Day 1 VBAC   Subjective: Lot of perineal discomfort and pain and swelling, using ice packs constantly. Wonders if C/s would have been better. Pt reassured, should feel better soon. Says she felt a bit better after shower and spray.  Able to void well with slight burning from urine touching. Lochia minimal.  Breast feeding.   Objective: Blood pressure 114/79, pulse 75, temperature 98.1 F (36.7 C), temperature source Oral, resp. rate 16, height 5\' 7"  (1.702 m), weight 81.2 kg, SpO2 98%, unknown if currently breastfeeding.  Physical Exam:  General: alert and cooperative Lochia: appropriate Uterine Fundus: firm Incision: healing well, no significant drainage, labial edema but no hematoma on palpation., perineum intact  DVT Evaluation: No evidence of DVT seen on physical exam. Negative Homan's sign.     Latest Ref Rng & Units 03/26/2023    4:42 AM 03/24/2023   10:23 PM 01/23/2022    8:32 AM  CBC  WBC 4.0 - 10.5 K/uL 15.4  14.0  7.8   Hemoglobin 12.0 - 15.0 g/dL 02.7  25.3  66.4   Hematocrit 36.0 - 46.0 % 32.6  41.3  41.0   Platelets 150 - 400 K/uL 177  238  307.0     B+ Rub imm   Assessment/Plan: PPD #1 VBAC, Boy Perineal pain- reassess as needed, appears as expected, stop ice after 24hrs. Oxycodone prn  LC assistance Boy breast feeding  NO circ  Desires D/c if baby okay  PP care/ pericare dw pt. Rto 6 wks but sooner if not improving    LOS: 2 days   Robley Fries, MD 03/26/2023, 7:35 AM

## 2023-03-26 NOTE — Progress Notes (Signed)
MOB was referred for history of anxiety.  * Referral screened out by Clinical Social Worker because none of the following criteria appear to apply:  ~ History of anxiety during this pregnancy, or of post-partum depression following prior delivery.  ~ Diagnosis of anxiety within last 3 years  OR  * MOB's symptoms currently being treated with medication and/or  therapy.  Per OB notes, MOB is prescribed Lexapro 5mg  for support.  Please contact the Clinical Social Worker if needs arise, by [facility] request, or if MOB scores greater than 9/yes to question 10 on Edinburgh Postpartum Depression Screen.  Enos Fling, Theresia Majors Clinical Social Worker [phone]

## 2023-04-02 ENCOUNTER — Telehealth (HOSPITAL_COMMUNITY): Payer: Self-pay | Admitting: *Deleted

## 2023-04-02 NOTE — Telephone Encounter (Signed)
04/02/2023  Name: Madyn Fullen MRN: [id number] DOB: October 14, 1990  Reason for Call:  Transition of Care Hospital Discharge Call  Contact Status: Patient Contact Status: Message  Language assistant needed:          Follow-Up Questions:    Inocente Salles Postnatal Depression Scale:  In the Past 7 Days:    PHQ2-9 Depression Scale:     Discharge Follow-up:    Post-discharge interventions: NA  Salena Saner, RN 04/02/2023 13:13

## 2024-03-08 ENCOUNTER — Ambulatory Visit: Payer: Self-pay

## 2024-03-08 NOTE — Telephone Encounter (Signed)
 Noted

## 2024-03-08 NOTE — Telephone Encounter (Signed)
 FYI Only or Action Required?: FYI only for provider: ED advised. Patient verbalized understanding but planning on proceeding to urgent care.   Patient was last seen in primary care on 05/20/2022 by Allwardt, Mardy HERO, PA-C.  Called Nurse Triage reporting Chest Pain.  Symptoms began a week ago.  Interventions attempted: Prescription medications: as prescribed and Rest, hydration, or home remedies.  Symptoms are: gradually worsening.  Triage Disposition: Call EMS 911 Now  Patient/caregiver understands and will follow disposition?: No  Copied from CRM #[id number]. Topic: Clinical - Red Word Triage >> Mar 08, 2024  9:15 AM Vena HERO wrote: Red Word that prompted transfer to Nurse Triage: last few weeks pt has had indigestion, headaches and chest pain Reason for Disposition  [1] Chest pain lasts > 5 minutes AND [2] described as crushing, pressure-like, or heavy  Answer Assessment - Initial Assessment Questions 911/ER advised, patient verbalized understanding but plans to proceed to urgent care.    1. LOCATION: Where does it hurt?       Chest pain-crushing 2. RADIATION: Does the pain go anywhere else? (e.g., into neck, jaw, arms, back)      3. ONSET: When did the chest pain begin? (Minutes, hours or days)      Two weeks 4. PATTERN: Does the pain come and go, or has it been constant since it started?  Does it get worse with exertion?      Constant but worse at night  5. DURATION: How long does it last (e.g., seconds, minutes, hours)     Two weeks  6. SEVERITY: How bad is the pain?  (e.g., Scale 1-10; mild, moderate, or severe)     6/10 7. CARDIAC RISK FACTORS: Do you have any history of heart problems or risk factors for heart disease? (e.g., angina, prior heart attack; diabetes, high blood pressure, high cholesterol, smoker, or strong family history of heart disease)      8. PULMONARY RISK FACTORS: Do you have any history of lung disease?  (e.g., blood clots in lung,  asthma, emphysema, birth control pills)      9. CAUSE: What do you think is causing the chest pain?     Indigestion-never diagnosed 10. OTHER SYMPTOMS: Do you have any other symptoms? (e.g., dizziness, nausea, vomiting, sweating, fever, difficulty breathing, cough)       Headache-not migraine.  11. PREGNANCY: Is there any chance you are pregnant? When was your last menstrual period?  Protocols used: Chest Pain-A-AH

## 2024-03-08 NOTE — Telephone Encounter (Signed)
 Please see triage note for pt; advised to go to the ED and pt states will go to [facility]

## 2024-03-15 ENCOUNTER — Encounter (HOSPITAL_COMMUNITY): Payer: Self-pay | Admitting: *Deleted

## 2024-03-15 ENCOUNTER — Emergency Department (HOSPITAL_COMMUNITY)
Admission: EM | Admit: 2024-03-15 | Discharge: 2024-03-15 | Disposition: A | Discharge status: Home / Self Care | Attending: Emergency Medicine | Admitting: Emergency Medicine

## 2024-03-15 ENCOUNTER — Emergency Department (HOSPITAL_COMMUNITY)

## 2024-03-15 ENCOUNTER — Ambulatory Visit: Payer: Self-pay

## 2024-03-15 ENCOUNTER — Other Ambulatory Visit: Payer: Self-pay

## 2024-03-15 DIAGNOSIS — R0602 Shortness of breath: Secondary | ICD-10-CM

## 2024-03-15 DIAGNOSIS — R079 Chest pain, unspecified: Secondary | ICD-10-CM

## 2024-03-15 LAB — CBC WITH DIFFERENTIAL/PLATELET
Abs Immature Granulocytes: 0.03 K/uL (ref 0.00–0.07)
Basophils Absolute: 0 K/uL (ref 0.0–0.1)
Basophils Relative: 0 %
Eosinophils Absolute: 0.1 K/uL (ref 0.0–0.5)
Eosinophils Relative: 1 %
HCT: 41.6 % (ref 36.0–46.0)
Hemoglobin: 13.7 g/dL (ref 12.0–15.0)
Immature Granulocytes: 0 %
Lymphocytes Relative: 22 %
Lymphs Abs: 1.7 K/uL (ref 0.7–4.0)
MCH: 30.6 pg (ref 26.0–34.0)
MCHC: 32.9 g/dL (ref 30.0–36.0)
MCV: 92.9 fL (ref 80.0–100.0)
Monocytes Absolute: 0.7 K/uL (ref 0.1–1.0)
Monocytes Relative: 10 %
Neutro Abs: 5 K/uL (ref 1.7–7.7)
Neutrophils Relative %: 67 %
Platelets: 269 K/uL (ref 150–400)
RBC: 4.48 MIL/uL (ref 3.87–5.11)
RDW: 13 % (ref 11.5–15.5)
WBC: 7.6 K/uL (ref 4.0–10.5)
nRBC: 0 % (ref 0.0–0.2)

## 2024-03-15 LAB — TROPONIN I (HIGH SENSITIVITY)
Troponin I (High Sensitivity): <2 ng/L (ref <18)
Troponin I (High Sensitivity): <2 ng/L (ref <18)

## 2024-03-15 LAB — COMPREHENSIVE METABOLIC PANEL WITH GFR
ALT: 18 U/L (ref 0–44)
AST: 22 U/L (ref 15–41)
Albumin: 4.2 g/dL (ref 3.5–5.0)
Alkaline Phosphatase: 48 U/L (ref 38–126)
Anion gap: 8 (ref 5–15)
BUN: 10 mg/dL (ref 6–20)
CO2: 25 mmol/L (ref 22–32)
Calcium: 9.7 mg/dL (ref 8.9–10.3)
Chloride: 104 mmol/L (ref 98–111)
Creatinine, Ser: 0.75 mg/dL (ref 0.44–1.00)
GFR, Estimated: >60 mL/min (ref >60)
Glucose, Bld: 81 mg/dL (ref 70–99)
Potassium: 4.1 mmol/L (ref 3.5–5.1)
Sodium: 137 mmol/L (ref 135–145)
Total Bilirubin: 1.4 mg/dL — ABNORMAL HIGH (ref 0.0–1.2)
Total Protein: 7.3 g/dL (ref 6.5–8.1)

## 2024-03-15 LAB — D-DIMER, QUANTITATIVE: D-Dimer, Quant: 0.82 ug{FEU}/mL — ABNORMAL HIGH (ref 0.00–0.50)

## 2024-03-15 LAB — LIPASE, BLOOD: Lipase: 27 U/L (ref 11–51)

## 2024-03-15 LAB — HCG, SERUM, QUALITATIVE: Preg, Serum: NEGATIVE

## 2024-03-15 LAB — TSH: TSH: 2.506 u[IU]/mL (ref 0.350–4.500)

## 2024-03-15 MED ORDER — IOHEXOL 350 MG/ML SOLN
75.0000 mL | Freq: Once | INTRAVENOUS | Status: AC | PRN
  Administered 2024-03-15: 75 mL via INTRAVENOUS

## 2024-03-15 MED ORDER — SODIUM CHLORIDE 0.9 % IV BOLUS
1000.0000 mL | Freq: Once | INTRAVENOUS | Status: AC
  Administered 2024-03-15: 1000 mL via INTRAVENOUS

## 2024-03-15 MED ORDER — ALUM & MAG HYDROXIDE-SIMETH 200-200-20 MG/5ML PO SUSP
30.0000 mL | Freq: Once | ORAL | Status: AC
  Administered 2024-03-15: 30 mL via ORAL
  Filled 2024-03-15: qty 30

## 2024-03-15 MED ORDER — FAMOTIDINE 20 MG PO TABS: 20.0000 mg | ORAL_TABLET | Freq: Two times a day (BID) | ORAL | 0 refills | Status: AC

## 2024-03-15 NOTE — Telephone Encounter (Signed)
Patient currently in ED for evaluation

## 2024-03-15 NOTE — ED Provider Triage Note (Signed)
 Emergency Medicine Provider Triage Evaluation Note  Darrah Rafaella Burnham , a 33 y.o. female  was evaluated in triage.  Pt complains of gerd  Pt with epigastric pain over last 3 weeks, getting worse. Post prandial. Pain from epigastrium up to her neck. Not improved w/ home meds. Began having some dyspnea/chest tightness with the sensation over last couple days. No hx lung dz per pt. No blood in stool or melena   Review of Systems  Positive: Epig pain Negative: Fever, vomit  Physical Exam  BP 133/81 (BP Location: Right Arm)   Pulse 74   Temp 98.2 F (36.8 C) (Oral)   Resp 20   Wt 71.7 kg   SpO2 100%   Breastfeeding Yes   BMI 24.75 kg/m  Gen:   Awake, no distress   Resp:  Normal effort  MSK:   Moves extremities without difficulty  Other:  Epig ttp, abd soft  Medical Decision Making  Medically screening exam initiated at 11:35 AM.  Appropriate orders placed.  Allicia Rafaella Schemm was informed that the remainder of the evaluation will be completed by another provider, this initial triage assessment does not replace that evaluation, and the importance of remaining in the ED until their evaluation is complete.     Elnor Jayson LABOR, DO 03/15/24 1136

## 2024-03-15 NOTE — Discharge Instructions (Addendum)
 Thank you for letting us  evaluate you today.  Your cardiac enzymes are negative x 2.  Your chest x-ray did not show any fluid.  Your CT scan did not show any blood clot.  You are not pregnant.  Your thyroid appears normal.  Please follow-up with primary care provider regarding chest pain, shortness of breath  I sent prescription for Pepcid  to trial to see if this helps with symptoms which would support reflux as a etiology of pain  Return to emergency room if experience chest pain, shortness of breath especially if they worsen with exertion, worsening symptoms

## 2024-03-15 NOTE — ED Triage Notes (Signed)
 Pt states that she has been having acid reflux and it is causing sob and chest pain.  This GERD has been going on for 2-3 weeks and in the last 5 days she has began feeling the CP and sob.  No cardiac hx

## 2024-03-15 NOTE — ED Notes (Signed)
 When you have time, Darren Nodal (Husband) [phone] would like an update on pt. Thank you

## 2024-03-15 NOTE — Telephone Encounter (Signed)
 FYI Only or Action Required?: FYI only for provider: ED advised.  Patient was last seen in primary care on 05/20/2022 by Allwardt, Mardy HERO, PA-C.  Called Nurse Triage reporting Fatigue (/), Headache, Heartburn (/), and Shortness of Breath (/).  Symptoms began several weeks ago.  Interventions attempted: OTC medications: Tums.  Symptoms are: gradually worsening.  Triage Disposition: Go to ED Now (Notify PCP)  Patient/caregiver understands and will follow disposition?: Unsure  Heartburn 2.5 weeks ago, constant, especially after eating. Denies nausea, low appetite.   Copied from CRM #[id number]. Topic: Clinical - Red Word Triage >> Mar 15, 2024 10:24 AM Tinnie BROCKS wrote: Red Word that prompted transfer to Nurse Triage: Requesting to make sick visit with [facility] due to headaches and shortness of breath and lethargy. Reason for Disposition  [1] Pain lasts > 10 minutes AND [2] difficulty breathing  Answer Assessment - Initial Assessment Questions Onset constant burning discomfort in upper mid abdomen/lower mid chest 2.5 weeks ago. Worse when laying down and after eat. Has tried tums, have not helped. Denies upper left or right CP. Denies back, shoulder, jaw or arm pain. New onset constant moderate SOB even at rest 2-3 days ago. Denies respiratory hx or other respiratory symptoms. Denies fever or hx of blood clot. Denies swelling or pain in any extremities. Advised ED, pt stated she would consider it and trusts triage opinion. Advised 911 for worsening symptoms.  1. LOCATION: Where does it hurt?      Upper mid abdomen/lower mid chest   2. RADIATION: Does the pain shoot anywhere else? (e.g., chest, back)     Denies  3. ONSET: When did the pain begin? (e.g., minutes, hours or days ago)      2.5 weeks ago  4. SUDDEN: Gradual or sudden onset?     Gradually  5. PATTERN Does the pain come and go, or is it constant?     Constant, worse when eating or laying down  6. SEVERITY:  How bad is the pain?  (e.g., Scale 1-10; mild, moderate, or severe)     No pain, just burning discomfort   7. RECURRENT SYMPTOM: Have you ever had this type of stomach pain before? If Yes, ask: When was the last time? and What happened that time?      Denies  8. AGGRAVATING FACTORS: Does anything seem to cause this pain? (e.g., foods, stress, alcohol)     Worse when eating or laying down  9. CARDIAC SYMPTOMS: Do you have any of the following symptoms: chest pain, difficulty breathing, sweating, nausea?     Moderate SOB all the time. Denies respiratory hx.  10. OTHER SYMPTOMS: Do you have any other symptoms? (e.g., back pain, diarrhea, fever, urination pain, vomiting)       SOB 2-3 days ago, moderate and constant.  Runny nose.  11. PREGNANCY: Is there any chance you are pregnant? When was your last menstrual period?       Denies. Gave birth 1 year ago, breast feeding.  Protocols used: Abdominal Pain - Upper-A-AH

## 2024-03-15 NOTE — Telephone Encounter (Signed)
 Please see triage note and advise; pt advised to report to ED and pt states she will consider it

## 2024-03-15 NOTE — ED Notes (Signed)
 Lab called regarding samples not in process

## 2024-03-15 NOTE — ED Provider Notes (Signed)
 [place] [facility] AT [facility] Provider Note   CSN: [id number] Arrival date & time: 03/15/24  1106     Patient presents with: Gastroesophageal Reflux   Mia Baker is a 33 y.o. female with past medical history of migraines, anxiety, appendectomy who is currently breast-feeding presents Emergency Department for evaluation of feelings of heartburn for past 2-1/2 weeks.  Over the last 3 days she has developed chest pain, shortness of breath.  Pain and shortness of breath are all the time but she occasionally has some mild exertional shortness of breath.  Pain is described as a squeezing that ranges from 5-7/10 in intensity.  She does not know anything that makes the pain worse.  She also endorses that she feels so fatigued over the past 3 weeks that she has been unable to work out.  She normally works out 3-4 times a week.  She also endorses symptoms of rhinorrhea over the past few days.  No cough or fever.  Only ate pistachios this morning and did not have any worsening pain following this.  Has taken Tums at home without relief.  Was provided Maalox in triage with some improvement of pain. No known hx of GERD prior to this. Denies history of PE/DVT, hemoptysis, recent travel, thinners, recent immobilization, pedal edema.  She currently has Kyleena IUD.    Gastroesophageal Reflux Associated symptoms include chest pain and shortness of breath.       Prior to Admission medications   Medication Sig Start Date End Date Taking? Authorizing Provider  famotidine  (PEPCID ) 20 MG tablet Take 1 tablet (20 mg total) by mouth 2 (two) times daily. 03/15/24  Yes Minnie Tinnie BRAVO, PA  acetaminophen  (TYLENOL ) 325 MG tablet Take 2 tablets (650 mg total) by mouth every 4 (four) hours as needed (for pain scale < 4). 03/26/23   Barbette Knock, MD  escitalopram  (LEXAPRO ) 20 MG tablet TAKE 1 TABLET BY MOUTH DAILY 02/23/23   Allwardt, Alyssa M, PA-C  escitalopram  (LEXAPRO ) 20 MG  tablet Take 1 tablet (20 mg total) by mouth daily. 03/26/23   Barbette Knock, MD  ibuprofen  (ADVIL ) 600 MG tablet Take 1 tablet (600 mg total) by mouth every 6 (six) hours. 03/26/23   Barbette Knock, MD    Allergies: Penicillins    Review of Systems  Respiratory:  Positive for shortness of breath.   Cardiovascular:  Positive for chest pain. Negative for leg swelling.    Updated Vital Signs BP 107/62   Pulse 66   Temp 98.2 F (36.8 C) (Oral)   Resp 17   Wt 71.7 kg   SpO2 100%   Breastfeeding Yes   BMI 24.75 kg/m   Physical Exam Vitals and nursing note reviewed.  Constitutional:      General: She is not in acute distress.    Appearance: Normal appearance.  HENT:     Head: Normocephalic and atraumatic.  Eyes:     Conjunctiva/sclera: Conjunctivae normal.  Cardiovascular:     Rate and Rhythm: Normal rate.  Pulmonary:     Effort: Pulmonary effort is normal. No respiratory distress.     Breath sounds: Normal breath sounds.     Comments: Maintaining oxygen saturation without supplementation.  No signs of respiratory distress nor tachypnea Abdominal:     General: Bowel sounds are normal. There is no distension.     Palpations: Abdomen is soft.     Tenderness: There is abdominal tenderness in the epigastric area. There is no guarding or  rebound.     Comments: Mild epi gastric tenderness.  Nonsurgical abdomen no peritoneal signs nor pulsatile mass  Musculoskeletal:     Cervical back: Normal range of motion and neck supple. No rigidity or tenderness.     Right lower leg: No edema.     Left lower leg: No edema.     Comments: No calf tenderness.  Toula' sign negative x 2 bilaterally  Skin:    Coloration: Skin is not jaundiced or pale.  Neurological:     Mental Status: She is alert and oriented to person, place, and time. Mental status is at baseline.     (all labs ordered are listed, but only abnormal results are displayed) Labs Reviewed  COMPREHENSIVE METABOLIC PANEL WITH  GFR - Abnormal; Notable for the following components:      Result Value   Total Bilirubin 1.4 (*)    All other components within normal limits  D-DIMER, QUANTITATIVE - Abnormal; Notable for the following components:   D-Dimer, Quant 0.82 (*)    All other components within normal limits  HCG, SERUM, QUALITATIVE  CBC WITH DIFFERENTIAL/PLATELET  LIPASE, BLOOD  TSH  T3, FREE  TROPONIN I (HIGH SENSITIVITY)  TROPONIN I (HIGH SENSITIVITY)    EKG: None  Radiology: CT Angio Chest PE W and/or Wo Contrast Result Date: 03/15/2024 EXAM: CTA of the Chest with contrast for PE 03/15/2024 09:41:35 PM TECHNIQUE: CTA of the chest was performed after the administration of intravenous contrast. Multiplanar reformatted images are provided for review. MIP images are provided for review. Automated exposure control, iterative reconstruction, and/or weight based adjustment of the mA/kV was utilized to reduce the radiation dose to as low as reasonably achievable. COMPARISON: None available. CLINICAL HISTORY: Pulmonary embolism (PE) suspected, high prob Chest pain and SOB FINDINGS: PULMONARY ARTERIES: Pulmonary arteries are adequately opacified for evaluation. No pulmonary embolism. Main pulmonary artery is normal in caliber. MEDIASTINUM: The heart and pericardium demonstrate no acute abnormality. There is no acute abnormality of the thoracic aorta. LYMPH NODES: No mediastinal, hilar or axillary lymphadenopathy. LUNGS AND PLEURA: The lungs are without acute process. No focal consolidation or pulmonary edema. No pleural effusion or pneumothorax. UPPER ABDOMEN: Limited images of the upper abdomen are unremarkable. SOFT TISSUES AND BONES: No acute bone or soft tissue abnormality. IMPRESSION: 1. No pulmonary embolism. 2. Normal CT chest. Electronically signed by: Pinkie Pebbles MD 03/15/2024 09:46 PM EST RP Workstation: [id number]   CT ABDOMEN PELVIS W CONTRAST Result Date: 03/15/2024 CLINICAL DATA:  Epigastric pain.   Gastroesophageal reflux disease. EXAM: CT ABDOMEN AND PELVIS WITH CONTRAST TECHNIQUE: Multidetector CT imaging of the abdomen and pelvis was performed using the standard protocol following bolus administration of intravenous contrast. RADIATION DOSE REDUCTION: This exam was performed according to the departmental dose-optimization program which includes automated exposure control, adjustment of the mA and/or kV according to patient size and/or use of iterative reconstruction technique. CONTRAST:  75mL OMNIPAQUE  IOHEXOL  350 MG/ML SOLN COMPARISON:  01/18/2015 FINDINGS: Lower Chest: No acute findings. Hepatobiliary: No suspicious hepatic masses identified. Gallbladder is unremarkable. No evidence of biliary ductal dilatation. Pancreas:  No mass or inflammatory changes. Spleen: Within normal limits in size and appearance. Adrenals/Urinary Tract: No suspicious masses identified. No evidence of ureteral calculi or hydronephrosis. Unremarkable unopacified urinary bladder. Stomach/Bowel: Stomach is unremarkable. No hiatal hernia seen. No evidence of bowel obstruction, inflammatory process or abnormal fluid collections. Vascular/Lymphatic: No pathologically enlarged lymph nodes. No acute vascular findings. Reproductive: IUD seen in appropriate position. No mass or other  significant abnormality. Other:  None. Musculoskeletal:  No suspicious bone lesions identified. IMPRESSION: Unremarkable exam. No acute findings or other significant abnormality. Electronically Signed   By: Norleen DELENA Kil M.D.   On: 03/15/2024 12:38   DG Chest 2 View Result Date: 03/15/2024 CLINICAL DATA:  Chest pain and shortness of breath. EXAM: CHEST - 2 VIEW COMPARISON:  03/17/2022 FINDINGS: The heart size and mediastinal contours are within normal limits. Both lungs are clear. The visualized skeletal structures are unremarkable. IMPRESSION: No active cardiopulmonary disease. Electronically Signed   By: Norleen DELENA Kil M.D.   On: 03/15/2024 12:22      Medications Ordered in the ED  alum & mag hydroxide-simeth (MAALOX/MYLANTA) 200-200-20 MG/5ML suspension 30 mL (30 mLs Oral Given 03/15/24 1140)  iohexol  (OMNIPAQUE ) 350 MG/ML injection 75 mL (75 mLs Intravenous Contrast Given 03/15/24 1209)  sodium chloride  0.9 % bolus 1,000 mL (0 mLs Intravenous Stopped 03/15/24 2154)  iohexol  (OMNIPAQUE ) 350 MG/ML injection 75 mL (75 mLs Intravenous Contrast Given 03/15/24 2138)                                    Medical Decision Making Amount and/or Complexity of Data Reviewed Labs: ordered. Radiology: ordered.  Risk Prescription drug management.     Patient presents to the ED for concern of chest pain, shortness of breath, fatigue, this involves an extensive number of treatment options, and is a complaint that carries with it a high risk of complications and morbidity.  The differential diagnosis includes hypothyroidism, ACS, PE, GERD, MSK, pneumonia, viral infection   Co morbidities that complicate the patient evaluation  None   Additional history obtained:  Additional history obtained from Nursing   External records from outside source obtained and reviewed including triage RN note   Lab Tests:  I Ordered, and personally interpreted labs.  The pertinent results include:   Troponin negative x 2 Dimer 0.82 TSH WNL No anemia nor leukocytosis Total bili 1.4   Imaging Studies ordered:  I ordered imaging studies including CT PE, CT abdomen, chest x-ray I independently visualized and interpreted imaging which showed  No acute intra-abdominal pathology No acute intrathoracic abnormalities I agree with the radiologist interpretation   Cardiac Monitoring:  The patient was maintained on a cardiac monitor.  I personally viewed and interpreted the cardiac monitored which showed an underlying rhythm of: NSR with no ischemic changes.  No ST no T wave abnormalities   Medicines ordered and prescription drug management:  I  ordered medication including maalox  for CP  Reevaluation of the patient after these medicines showed that the patient improved I have reviewed the patients home medicines and have made adjustments as needed    Problem List / ED Course:  Fatigue TSH normal. T3 pending but can f/u with PCP regarding results. Unlikely thyroid as etiology of fatigue Did endorse some rhinorrhea over past few days but no other infectious complaints. Denies urinary symptoms. Unlikely UTI contributing No anemia  CP Per triage note, patient had describes some postprandial chest pain and symptoms of reflux.  Her symptoms improved with Maalox provided in triage but then subsequently returned.  Has tried Tums at home without improvement.  She has only had peanuts earlier this morning and has not had any since but continues to have pain.  CT imaging of her abdomen did not show any erosive is gastritis or perforation.  There were no acute changes in  abdomen Chest pain fortunately does not sound exertional per patient.  She is unable to distinguish 1 pain worsens as it is random.  To me, she endorses that chest pain is not postprandial with associated shob so did subsequently proceed with dimer to ensure no PE as etiology No known traumatic injury to chest.  No reproducible chest pain with palpation of chest Cardiac workup here is reassuring.  She has low heart score.  Unlikely to have cardiac event within the next 6 months and can follow-up with PCP regarding chest pain. Low suspicion of dissection with no HTN, neurological sx, no radiation of pain into back Low suspicion for esophageal rupture with no recent retching, excessive coughing. No emphysema palpated on PE. No complaints of hoarseness, sore throat, dysphagia Low suspicion for tamponade with no Beck's triad on exam nor hx of traumatic injury Can trial pepcid  over next 4 weeks to see if this improves symptoms as maalox helped with symptoms here in ED to see if they  are reflux related  SHOB Lung sounds CTAB. No whezzing nor rales No cough congestion.  Is having some rhinorrhea.  X-ray negative for pneumonia. Does not appear fluid overloaded with no effusion on x-ray nor pedal edema No calf tenderness nor swelling.  Toula' sign negative x 2.  Does not appear to have signs of DVT. Ultimately has very low risk for PE other than IUD.  Did obtain dimer and unfortunately this was mildly elevated so will need to proceed with CT imaging Spoke to radiologist with [facility] regarding obtaining CT PE to r/o PE as etiology with elevated dimer with having a CT 7 hours ago. They recommend, with patient having no co morbidities to include ESRD, CKD, and is otherwise healthy can proceed with imaging with lowest required imaging. I also provided her with IVF  CTPE negative. Ambulated patient with no complaints of CP, SHOB. No hypoxia with ambulation   Reevaluation:  After the interventions noted above, I reevaluated the patient and found that they have :stayed the same    Dispostion:  After consideration of the diagnostic results and the patients response to treatment, I feel that the patent would benefit from outpatient management with PCP f/u.   Discussed ED workup, disposition, return to ED precautions with patient who expresses understanding agrees with plan.  All questions answered to their satisfaction.  They are agreeable to plan.  Discharge instructions provided on paperwork  Final diagnoses:  Chest pain, unspecified type  Shortness of breath    ED Discharge Orders          Ordered    famotidine  (PEPCID ) 20 MG tablet  2 times daily        03/15/24 2235             Minnie Tinnie BRAVO, PA 03/15/24 2237    Melvenia Motto, MD 03/15/24 2350

## 2024-03-17 LAB — T3, FREE: T3, Free: 2.7 pg/mL (ref 2.0–4.4)
